# Patient Record
Sex: Female | Born: 1935 | Race: Black or African American | Hispanic: No | Smoking: Former smoker
Health system: Southern US, Community
[De-identification: ages and names within clinical notes are randomized; demographics above are authoritative.]

## PROBLEM LIST (undated history)

## (undated) DIAGNOSIS — L905 Scar conditions and fibrosis of skin: Secondary | ICD-10-CM

## (undated) DIAGNOSIS — E785 Hyperlipidemia, unspecified: Secondary | ICD-10-CM

## (undated) DIAGNOSIS — I5042 Chronic combined systolic (congestive) and diastolic (congestive) heart failure: Secondary | ICD-10-CM

## (undated) DIAGNOSIS — B029 Zoster without complications: Secondary | ICD-10-CM

## (undated) DIAGNOSIS — I10 Essential (primary) hypertension: Secondary | ICD-10-CM

## (undated) DIAGNOSIS — J449 Chronic obstructive pulmonary disease, unspecified: Secondary | ICD-10-CM

## (undated) DIAGNOSIS — M1990 Unspecified osteoarthritis, unspecified site: Secondary | ICD-10-CM

## (undated) DIAGNOSIS — T300 Burn of unspecified body region, unspecified degree: Secondary | ICD-10-CM

## (undated) DIAGNOSIS — M1712 Unilateral primary osteoarthritis, left knee: Secondary | ICD-10-CM

## (undated) DIAGNOSIS — Z720 Tobacco use: Secondary | ICD-10-CM

## (undated) DIAGNOSIS — I2510 Atherosclerotic heart disease of native coronary artery without angina pectoris: Secondary | ICD-10-CM

## (undated) HISTORY — DX: Unspecified osteoarthritis, unspecified site: M19.90

## (undated) HISTORY — DX: Unilateral primary osteoarthritis, left knee: M17.12

## (undated) HISTORY — DX: Atherosclerotic heart disease of native coronary artery without angina pectoris: I25.10

## (undated) HISTORY — DX: Tobacco use: Z72.0

## (undated) HISTORY — PX: OTHER SURGICAL HISTORY: SHX169

## (undated) HISTORY — DX: Hyperlipidemia, unspecified: E78.5

## (undated) HISTORY — DX: Scar conditions and fibrosis of skin: L90.5

## (undated) HISTORY — DX: Chronic obstructive pulmonary disease, unspecified: J44.9

## (undated) HISTORY — DX: Essential (primary) hypertension: I10

## (undated) HISTORY — PX: ABDOMINAL HYSTERECTOMY: SHX81

## (undated) HISTORY — DX: Zoster without complications: B02.9

## (undated) HISTORY — PX: SHOULDER ARTHROSCOPY: SHX128

## (undated) SURGERY — Surgical Case
Anesthesia: *Unknown

---

## 2005-09-16 HISTORY — PX: REPLACEMENT TOTAL KNEE: SUR1224

## 2012-10-23 DIAGNOSIS — D649 Anemia, unspecified: Secondary | ICD-10-CM | POA: Diagnosis not present

## 2012-10-23 DIAGNOSIS — I10 Essential (primary) hypertension: Secondary | ICD-10-CM | POA: Diagnosis not present

## 2012-10-23 DIAGNOSIS — R319 Hematuria, unspecified: Secondary | ICD-10-CM | POA: Diagnosis not present

## 2012-10-23 DIAGNOSIS — R809 Proteinuria, unspecified: Secondary | ICD-10-CM | POA: Diagnosis not present

## 2012-11-03 ENCOUNTER — Other Ambulatory Visit: Payer: Self-pay | Admitting: Nephrology

## 2012-11-03 LAB — CBC WITH DIFFERENTIAL/PLATELET
Basophil #: 0.1 10*3/uL (ref 0.0–0.1)
Eosinophil #: 0.1 10*3/uL (ref 0.0–0.7)
Eosinophil %: 1.1 %
HCT: 39.1 % (ref 35.0–47.0)
HGB: 12.2 g/dL (ref 12.0–16.0)
Lymphocyte #: 1.8 10*3/uL (ref 1.0–3.6)
Lymphocyte %: 27.6 %
MCH: 28.2 pg (ref 26.0–34.0)
MCHC: 31.3 g/dL — ABNORMAL LOW (ref 32.0–36.0)
Monocyte #: 0.7 x10 3/mm (ref 0.2–0.9)
Monocyte %: 11.3 %
Neutrophil %: 58.9 %
Platelet: 234 10*3/uL (ref 150–440)
RBC: 4.34 10*6/uL (ref 3.80–5.20)
RDW: 17.5 % — ABNORMAL HIGH (ref 11.5–14.5)
WBC: 6.5 10*3/uL (ref 3.6–11.0)

## 2012-11-03 LAB — IRON AND TIBC
Iron Bind.Cap.(Total): 355 ug/dL (ref 250–450)
Iron Saturation: 14 %
Iron: 51 ug/dL (ref 50–170)
Unbound Iron-Bind.Cap.: 304 ug/dL

## 2012-11-03 LAB — RENAL FUNCTION PANEL
Albumin: 3.4 g/dL (ref 3.4–5.0)
Anion Gap: 5 — ABNORMAL LOW (ref 7–16)
Calcium, Total: 8.5 mg/dL (ref 8.5–10.1)
Chloride: 112 mmol/L — ABNORMAL HIGH (ref 98–107)
Glucose: 78 mg/dL (ref 65–99)
Osmolality: 291 (ref 275–301)
Phosphorus: 3 mg/dL (ref 2.5–4.9)
Potassium: 3.8 mmol/L (ref 3.5–5.1)
Sodium: 145 mmol/L (ref 136–145)

## 2012-11-03 LAB — FERRITIN: Ferritin (ARMC): 16 ng/mL (ref 8–388)

## 2012-11-03 LAB — T4, FREE: Free Thyroxine: 0.92 ng/dL (ref 0.76–1.46)

## 2012-11-03 LAB — URIC ACID: Uric Acid: 4.7 mg/dL (ref 2.6–6.0)

## 2012-11-04 LAB — PROTEIN ELECTROPHORESIS(ARMC)

## 2012-11-06 DIAGNOSIS — N2581 Secondary hyperparathyroidism of renal origin: Secondary | ICD-10-CM | POA: Diagnosis not present

## 2012-11-10 LAB — PES,RFLX IFE+FREE K/L LT

## 2012-12-23 ENCOUNTER — Other Ambulatory Visit: Payer: Self-pay | Admitting: Ophthalmology

## 2012-12-23 DIAGNOSIS — H16009 Unspecified corneal ulcer, unspecified eye: Secondary | ICD-10-CM | POA: Diagnosis not present

## 2012-12-24 DIAGNOSIS — H16009 Unspecified corneal ulcer, unspecified eye: Secondary | ICD-10-CM | POA: Diagnosis not present

## 2012-12-25 DIAGNOSIS — H16009 Unspecified corneal ulcer, unspecified eye: Secondary | ICD-10-CM | POA: Diagnosis not present

## 2012-12-27 LAB — WOUND CULTURE

## 2012-12-28 DIAGNOSIS — H16009 Unspecified corneal ulcer, unspecified eye: Secondary | ICD-10-CM | POA: Diagnosis not present

## 2014-03-21 DIAGNOSIS — M79609 Pain in unspecified limb: Secondary | ICD-10-CM | POA: Diagnosis not present

## 2014-03-21 DIAGNOSIS — B351 Tinea unguium: Secondary | ICD-10-CM | POA: Diagnosis not present

## 2014-04-21 ENCOUNTER — Ambulatory Visit: Payer: Self-pay | Admitting: Family Medicine

## 2014-04-21 DIAGNOSIS — I517 Cardiomegaly: Secondary | ICD-10-CM | POA: Diagnosis not present

## 2014-04-21 DIAGNOSIS — R062 Wheezing: Secondary | ICD-10-CM | POA: Diagnosis not present

## 2014-04-21 DIAGNOSIS — F17200 Nicotine dependence, unspecified, uncomplicated: Secondary | ICD-10-CM | POA: Diagnosis not present

## 2014-04-21 DIAGNOSIS — J441 Chronic obstructive pulmonary disease with (acute) exacerbation: Secondary | ICD-10-CM | POA: Diagnosis not present

## 2014-04-21 DIAGNOSIS — R059 Cough, unspecified: Secondary | ICD-10-CM | POA: Diagnosis not present

## 2014-04-21 DIAGNOSIS — R05 Cough: Secondary | ICD-10-CM | POA: Diagnosis not present

## 2014-04-21 DIAGNOSIS — I10 Essential (primary) hypertension: Secondary | ICD-10-CM | POA: Diagnosis not present

## 2014-04-25 DIAGNOSIS — F17200 Nicotine dependence, unspecified, uncomplicated: Secondary | ICD-10-CM | POA: Diagnosis not present

## 2014-04-25 DIAGNOSIS — I10 Essential (primary) hypertension: Secondary | ICD-10-CM | POA: Diagnosis not present

## 2014-04-25 DIAGNOSIS — J45901 Unspecified asthma with (acute) exacerbation: Secondary | ICD-10-CM | POA: Diagnosis not present

## 2014-04-25 DIAGNOSIS — I2510 Atherosclerotic heart disease of native coronary artery without angina pectoris: Secondary | ICD-10-CM | POA: Diagnosis not present

## 2014-04-25 DIAGNOSIS — Z23 Encounter for immunization: Secondary | ICD-10-CM | POA: Diagnosis not present

## 2014-04-25 DIAGNOSIS — J441 Chronic obstructive pulmonary disease with (acute) exacerbation: Secondary | ICD-10-CM | POA: Diagnosis not present

## 2014-05-16 DIAGNOSIS — F17200 Nicotine dependence, unspecified, uncomplicated: Secondary | ICD-10-CM | POA: Diagnosis not present

## 2014-05-16 DIAGNOSIS — M1710 Unilateral primary osteoarthritis, unspecified knee: Secondary | ICD-10-CM | POA: Diagnosis not present

## 2014-05-16 DIAGNOSIS — Z0000 Encounter for general adult medical examination without abnormal findings: Secondary | ICD-10-CM | POA: Diagnosis not present

## 2014-05-17 ENCOUNTER — Other Ambulatory Visit: Payer: Self-pay | Admitting: Family Medicine

## 2014-05-17 DIAGNOSIS — M2550 Pain in unspecified joint: Secondary | ICD-10-CM | POA: Diagnosis not present

## 2014-05-17 DIAGNOSIS — R809 Proteinuria, unspecified: Secondary | ICD-10-CM | POA: Diagnosis not present

## 2014-05-17 DIAGNOSIS — I2510 Atherosclerotic heart disease of native coronary artery without angina pectoris: Secondary | ICD-10-CM | POA: Diagnosis not present

## 2014-05-17 DIAGNOSIS — E785 Hyperlipidemia, unspecified: Secondary | ICD-10-CM | POA: Diagnosis not present

## 2014-05-17 LAB — COMPREHENSIVE METABOLIC PANEL
ALBUMIN: 3.6 g/dL (ref 3.4–5.0)
ALK PHOS: 68 U/L
ALT: 22 U/L
Anion Gap: 11 (ref 7–16)
BUN: 20 mg/dL — ABNORMAL HIGH (ref 7–18)
Bilirubin,Total: 0.2 mg/dL (ref 0.2–1.0)
CHLORIDE: 106 mmol/L (ref 98–107)
CO2: 23 mmol/L (ref 21–32)
CREATININE: 0.54 mg/dL — AB (ref 0.60–1.30)
Calcium, Total: 8.8 mg/dL (ref 8.5–10.1)
EGFR (African American): >60
EGFR (Non-African Amer.): >60
Glucose: 72 mg/dL (ref 65–99)
OSMOLALITY: 281 (ref 275–301)
Potassium: 4.2 mmol/L (ref 3.5–5.1)
SGOT(AST): 24 U/L (ref 15–37)
SODIUM: 140 mmol/L (ref 136–145)
TOTAL PROTEIN: 7.9 g/dL (ref 6.4–8.2)

## 2014-05-17 LAB — LIPID PANEL
Cholesterol: 194 mg/dL (ref 0–200)
HDL Cholesterol: 80 mg/dL — ABNORMAL HIGH (ref 40–60)
Ldl Cholesterol, Calc: 102 mg/dL — ABNORMAL HIGH (ref 0–100)
TRIGLYCERIDES: 60 mg/dL (ref 0–200)
VLDL Cholesterol, Calc: 12 mg/dL (ref 5–40)

## 2014-06-06 ENCOUNTER — Ambulatory Visit: Payer: Self-pay | Admitting: Cardiovascular Disease

## 2014-06-08 ENCOUNTER — Encounter: Payer: Self-pay | Admitting: Cardiovascular Disease

## 2014-06-08 ENCOUNTER — Ambulatory Visit (INDEPENDENT_AMBULATORY_CARE_PROVIDER_SITE_OTHER): Payer: Medicare Other | Admitting: Cardiovascular Disease

## 2014-06-08 ENCOUNTER — Encounter (INDEPENDENT_AMBULATORY_CARE_PROVIDER_SITE_OTHER): Payer: Self-pay

## 2014-06-08 VITALS — BP 118/66 | HR 96 | Ht 66.0 in | Wt 158.5 lb

## 2014-06-08 DIAGNOSIS — I351 Nonrheumatic aortic (valve) insufficiency: Secondary | ICD-10-CM | POA: Insufficient documentation

## 2014-06-08 DIAGNOSIS — Z13820 Encounter for screening for osteoporosis: Secondary | ICD-10-CM | POA: Diagnosis not present

## 2014-06-08 DIAGNOSIS — R000 Tachycardia, unspecified: Secondary | ICD-10-CM

## 2014-06-08 DIAGNOSIS — R0602 Shortness of breath: Secondary | ICD-10-CM

## 2014-06-08 DIAGNOSIS — R011 Cardiac murmur, unspecified: Secondary | ICD-10-CM

## 2014-06-08 DIAGNOSIS — I2510 Atherosclerotic heart disease of native coronary artery without angina pectoris: Secondary | ICD-10-CM | POA: Insufficient documentation

## 2014-06-08 DIAGNOSIS — I10 Essential (primary) hypertension: Secondary | ICD-10-CM | POA: Diagnosis not present

## 2014-06-08 DIAGNOSIS — I359 Nonrheumatic aortic valve disorder, unspecified: Secondary | ICD-10-CM

## 2014-06-08 DIAGNOSIS — F17200 Nicotine dependence, unspecified, uncomplicated: Secondary | ICD-10-CM

## 2014-06-08 DIAGNOSIS — E785 Hyperlipidemia, unspecified: Secondary | ICD-10-CM | POA: Insufficient documentation

## 2014-06-08 DIAGNOSIS — E2839 Other primary ovarian failure: Secondary | ICD-10-CM | POA: Diagnosis not present

## 2014-06-08 MED ORDER — METOPROLOL SUCCINATE ER 50 MG PO TB24: 50.0000 mg | ORAL_TABLET | Freq: Two times a day (BID) | ORAL | Status: DC

## 2014-06-08 NOTE — Assessment & Plan Note (Signed)
Recommended that she stay on her Lipitor given her continued smoking.

## 2014-06-08 NOTE — Assessment & Plan Note (Signed)
Tachycardia on EKG and on clinical exam today. We have recommended she increase her metoprolol succinate up to 50 mg twice a day, hold her hydralazine for now. Family members will monitor her blood pressure and restart hydralazine for systolic pressure more than 140.

## 2014-06-08 NOTE — Assessment & Plan Note (Signed)
Long history of smoking, likely COPD. She has a chronic cough consistent with chronic bronchitis. Discussed smoking cessation with her. She has Chantix but is afraid to use it

## 2014-06-08 NOTE — Patient Instructions (Addendum)
You are doing well.  Please hold the hydralazine Please increase the metoprolol up to one pill twice a day (Am and PM)  Please monitor your blood pressure at home  We will schedule you for an echocardiogram for the heart miurmur, fast heart rate/tachycardia  Please call us if you have new issues that need to be addressed before your next appt.  Your physician wants you to follow-up in: 6 months.  You will receive a reminder letter in the mail two months in advance. If you don't receive a letter, please call our office to schedule the follow-up appointment.

## 2014-06-08 NOTE — Progress Notes (Addendum)
Patient ID: Helen Moore, female    DOB: [birth year], 79 y.o.   MRN: [id number]  HPI Comments: Helen Moore is a pleasant 79 year old woman with long history of smoking who continues to smoke, COPD, hypertension, report of prior MI in June 2013 who presents to establish care. She is a patient of Dr. Sherie Don.  Family presents with her today. Family reports that in June 2013, she had an episode where she could not get out of bed. She stayed in bed for 3 days and could not get up. EMTs were called. Family reports a bladder infection, other workup that was done but they are unsure. When asked a cardiac catheterization was completed, they are unclear  Records were requested from [facility] in [place] Records detail 02/27/2012 a VQ scan showing low probability for PE Stress test showing ejection fraction 56%, with no abnormalities, no significant ischemia Echocardiogram showing mild to moderate aortic valve insufficiency, diastolic dysfunction, ejection fraction 45-50%  She reports that in general she feels well. She denies any significant chest pain with exertion. She does have some shortness of breath at times. She denies any significant lower extremity edema. He does report that she stop smoking for several years, but in general has been smoking for most of her life. She does have chronic arthritic pain, takes pain medications on an occasional basis  In terms of her family history, mother died in her 76s, father in his 34s  EKG today shows normal sinus rhythm with rate 96 beats per minute, poor R-wave progression through the anterior precordial leads, nonspecific ST abnormality in lead V6, aVL, left anterior fascicular block   Outpatient Encounter Prescriptions as of 06/08/2014  Medication Sig  . amLODipine (NORVASC) 10 MG tablet Take 10 mg by mouth daily.  Marland Kitchen aspirin 81 MG tablet Take 81 mg by mouth daily.  Marland Kitchen atorvastatin (LIPITOR) 40 MG tablet Take 40 mg by mouth daily.   . hydrALAZINE (APRESOLINE) 25 MG tablet Take 1 tablet (25 mg total) by mouth 3 (three) times daily as needed.  Marland Kitchen HYDROcodone-acetaminophen (NORCO/VICODIN) 5-325 MG per tablet Take 1 tablet by mouth 2 (two) times daily.  Marland Kitchen omeprazole (PRILOSEC) 40 MG capsule Take 40 mg by mouth daily.  . hydrALAZINE (APRESOLINE) 25 MG tablet Take 25 mg by mouth 3 (three) times daily.  . metoprolol succinate (TOPROL-XL) 50 MG 24 hr tablet Take 50 mg by mouth daily. Take with or immediately following a meal.    Review of Systems  Constitutional: Negative.   HENT: Negative.   Eyes: Negative.   Respiratory: Negative.   Cardiovascular: Negative.   Gastrointestinal: Negative.   Endocrine: Negative.   Musculoskeletal: Negative.   Skin: Negative.   Allergic/Immunologic: Negative.   Neurological: Negative.   Hematological: Negative.   Psychiatric/Behavioral: Negative.   All other systems reviewed and are negative.   BP 118/66  Pulse 96  Ht  (1.676 m)  Wt 158 lb 8 oz (71.895 kg)  BMI 25.59 kg/m2  Physical Exam  Nursing note and vitals reviewed. Constitutional: She is oriented to person, place, and time. She appears well-developed and well-nourished.  HENT:  Head: Normocephalic.  Nose: Nose normal.  Mouth/Throat: Oropharynx is clear and moist.  Eyes: Conjunctivae are normal. Pupils are equal, round, and reactive to light.  Neck: Normal range of motion. Neck supple. No JVD present.  Cardiovascular: Normal rate, regular rhythm, S1 normal, S2 normal and intact distal pulses.  Exam reveals no gallop and no friction rub.  Murmur heard.  Diastolic murmur is present with a grade of 2/6  Pulmonary/Chest: Effort normal. No respiratory distress. She has decreased breath sounds. She has no wheezes. She has no rales. She exhibits no tenderness.  Abdominal: Soft. Bowel sounds are normal. She exhibits no distension. There is no tenderness.  Musculoskeletal: Normal range of motion. She exhibits no edema and  no tenderness.  Lymphadenopathy:    She has no cervical adenopathy.  Neurological: She is alert and oriented to person, place, and time. Coordination normal.  Skin: Skin is warm and dry. No rash noted. No erythema.  Psychiatric: She has a normal mood and affect. Her behavior is normal. Judgment and thought content normal.    Assessment and Plan

## 2014-06-08 NOTE — Assessment & Plan Note (Signed)
Metoprolol dose was increased up to 50 mg twice a day, we'll hold hydralazine for systolic pressure today is 116. We'll restart hydralazine if blood pressure runs high

## 2014-06-08 NOTE — Assessment & Plan Note (Signed)
Echocardiogram in 2013 showing mild-to-moderate disease. Murmur on exam.

## 2014-06-08 NOTE — Assessment & Plan Note (Signed)
Echocardiogram several years ago showing mild to moderate aortic valve insufficiency. Prominent murmur on exam today. She and family are requesting repeat echocardiogram. Order has been placed.

## 2014-06-08 NOTE — Assessment & Plan Note (Signed)
Events of June 2013 showed normal stress test with no previous infarct. Ejection fraction on echocardiogram at that time was 45-50%. I suspect she had a urinary tract infection, spent 3 days in bed, had a minimal troponin elevation consistent with demand ischemia. No cardiac catheterization done at that time. No ischemia on stress test. Currently with no symptoms of angina at this time. Continue aggressive medical management, recommend smoking cessation. Echocardiogram has been ordered to evaluate her aortic valve, reassess her ejection fraction

## 2014-06-13 DIAGNOSIS — M1710 Unilateral primary osteoarthritis, unspecified knee: Secondary | ICD-10-CM | POA: Diagnosis not present

## 2014-06-16 ENCOUNTER — Other Ambulatory Visit (INDEPENDENT_AMBULATORY_CARE_PROVIDER_SITE_OTHER): Payer: Medicare Other

## 2014-06-16 ENCOUNTER — Other Ambulatory Visit: Payer: Self-pay

## 2014-06-16 DIAGNOSIS — I351 Nonrheumatic aortic (valve) insufficiency: Secondary | ICD-10-CM

## 2014-06-16 DIAGNOSIS — R0602 Shortness of breath: Secondary | ICD-10-CM

## 2014-06-16 DIAGNOSIS — I10 Essential (primary) hypertension: Secondary | ICD-10-CM

## 2014-06-16 DIAGNOSIS — R011 Cardiac murmur, unspecified: Secondary | ICD-10-CM | POA: Diagnosis not present

## 2014-06-23 ENCOUNTER — Ambulatory Visit: Payer: Self-pay | Admitting: Family Medicine

## 2014-06-23 DIAGNOSIS — I2510 Atherosclerotic heart disease of native coronary artery without angina pectoris: Secondary | ICD-10-CM | POA: Diagnosis not present

## 2014-06-23 DIAGNOSIS — Z122 Encounter for screening for malignant neoplasm of respiratory organs: Secondary | ICD-10-CM | POA: Diagnosis not present

## 2014-06-23 DIAGNOSIS — Z136 Encounter for screening for cardiovascular disorders: Secondary | ICD-10-CM | POA: Diagnosis not present

## 2014-06-23 DIAGNOSIS — F17210 Nicotine dependence, cigarettes, uncomplicated: Secondary | ICD-10-CM | POA: Diagnosis not present

## 2014-06-23 DIAGNOSIS — J449 Chronic obstructive pulmonary disease, unspecified: Secondary | ICD-10-CM | POA: Diagnosis not present

## 2014-06-23 DIAGNOSIS — F17200 Nicotine dependence, unspecified, uncomplicated: Secondary | ICD-10-CM | POA: Diagnosis not present

## 2014-08-10 DIAGNOSIS — I10 Essential (primary) hypertension: Secondary | ICD-10-CM | POA: Diagnosis not present

## 2014-08-10 DIAGNOSIS — J441 Chronic obstructive pulmonary disease with (acute) exacerbation: Secondary | ICD-10-CM | POA: Diagnosis not present

## 2014-08-10 DIAGNOSIS — M179 Osteoarthritis of knee, unspecified: Secondary | ICD-10-CM | POA: Diagnosis not present

## 2014-08-10 DIAGNOSIS — J449 Chronic obstructive pulmonary disease, unspecified: Secondary | ICD-10-CM | POA: Diagnosis not present

## 2014-09-05 DIAGNOSIS — Z79891 Long term (current) use of opiate analgesic: Secondary | ICD-10-CM | POA: Diagnosis not present

## 2014-09-05 DIAGNOSIS — M179 Osteoarthritis of knee, unspecified: Secondary | ICD-10-CM | POA: Diagnosis not present

## 2014-11-02 DIAGNOSIS — F17290 Nicotine dependence, other tobacco product, uncomplicated: Secondary | ICD-10-CM | POA: Diagnosis not present

## 2014-11-02 DIAGNOSIS — J449 Chronic obstructive pulmonary disease, unspecified: Secondary | ICD-10-CM | POA: Diagnosis not present

## 2014-11-02 DIAGNOSIS — I10 Essential (primary) hypertension: Secondary | ICD-10-CM | POA: Diagnosis not present

## 2014-11-02 DIAGNOSIS — I2510 Atherosclerotic heart disease of native coronary artery without angina pectoris: Secondary | ICD-10-CM | POA: Diagnosis not present

## 2014-12-14 DIAGNOSIS — Z79891 Long term (current) use of opiate analgesic: Secondary | ICD-10-CM | POA: Diagnosis not present

## 2014-12-14 DIAGNOSIS — I2510 Atherosclerotic heart disease of native coronary artery without angina pectoris: Secondary | ICD-10-CM | POA: Diagnosis not present

## 2014-12-14 DIAGNOSIS — E785 Hyperlipidemia, unspecified: Secondary | ICD-10-CM | POA: Diagnosis not present

## 2014-12-14 DIAGNOSIS — I10 Essential (primary) hypertension: Secondary | ICD-10-CM | POA: Diagnosis not present

## 2015-01-24 DIAGNOSIS — M79671 Pain in right foot: Secondary | ICD-10-CM | POA: Diagnosis not present

## 2015-01-24 DIAGNOSIS — M79672 Pain in left foot: Secondary | ICD-10-CM | POA: Diagnosis not present

## 2015-01-24 DIAGNOSIS — B351 Tinea unguium: Secondary | ICD-10-CM | POA: Diagnosis not present

## 2015-01-30 DIAGNOSIS — F17290 Nicotine dependence, other tobacco product, uncomplicated: Secondary | ICD-10-CM | POA: Diagnosis not present

## 2015-01-30 DIAGNOSIS — J449 Chronic obstructive pulmonary disease, unspecified: Secondary | ICD-10-CM | POA: Diagnosis not present

## 2015-01-30 DIAGNOSIS — I10 Essential (primary) hypertension: Secondary | ICD-10-CM | POA: Diagnosis not present

## 2015-01-30 DIAGNOSIS — I2510 Atherosclerotic heart disease of native coronary artery without angina pectoris: Secondary | ICD-10-CM | POA: Diagnosis not present

## 2015-02-03 DIAGNOSIS — M1990 Unspecified osteoarthritis, unspecified site: Secondary | ICD-10-CM | POA: Diagnosis not present

## 2015-02-03 DIAGNOSIS — M25561 Pain in right knee: Secondary | ICD-10-CM | POA: Diagnosis not present

## 2015-02-03 DIAGNOSIS — Z96651 Presence of right artificial knee joint: Secondary | ICD-10-CM | POA: Diagnosis not present

## 2015-02-03 DIAGNOSIS — M25511 Pain in right shoulder: Secondary | ICD-10-CM | POA: Diagnosis not present

## 2015-04-06 DIAGNOSIS — M1990 Unspecified osteoarthritis, unspecified site: Secondary | ICD-10-CM | POA: Insufficient documentation

## 2015-04-06 DIAGNOSIS — J441 Chronic obstructive pulmonary disease with (acute) exacerbation: Secondary | ICD-10-CM | POA: Insufficient documentation

## 2015-04-06 DIAGNOSIS — I10 Essential (primary) hypertension: Secondary | ICD-10-CM | POA: Insufficient documentation

## 2015-04-06 DIAGNOSIS — I2510 Atherosclerotic heart disease of native coronary artery without angina pectoris: Secondary | ICD-10-CM | POA: Insufficient documentation

## 2015-04-06 DIAGNOSIS — Z720 Tobacco use: Secondary | ICD-10-CM | POA: Insufficient documentation

## 2015-04-07 ENCOUNTER — Ambulatory Visit (INDEPENDENT_AMBULATORY_CARE_PROVIDER_SITE_OTHER): Payer: Medicare Other | Admitting: Family Medicine

## 2015-04-07 ENCOUNTER — Encounter: Payer: Self-pay | Admitting: Family Medicine

## 2015-04-07 VITALS — BP 125/79 | HR 106 | Temp 97.7°F | Ht 62.7 in | Wt 152.0 lb

## 2015-04-07 DIAGNOSIS — T1490 Injury, unspecified: Secondary | ICD-10-CM | POA: Diagnosis not present

## 2015-04-07 DIAGNOSIS — J439 Emphysema, unspecified: Secondary | ICD-10-CM

## 2015-04-07 DIAGNOSIS — I2510 Atherosclerotic heart disease of native coronary artery without angina pectoris: Secondary | ICD-10-CM

## 2015-04-07 DIAGNOSIS — S50311A Abrasion of right elbow, initial encounter: Secondary | ICD-10-CM

## 2015-04-07 DIAGNOSIS — Z720 Tobacco use: Secondary | ICD-10-CM | POA: Diagnosis not present

## 2015-04-07 DIAGNOSIS — R000 Tachycardia, unspecified: Secondary | ICD-10-CM

## 2015-04-07 DIAGNOSIS — I10 Essential (primary) hypertension: Secondary | ICD-10-CM | POA: Diagnosis not present

## 2015-04-07 DIAGNOSIS — Z129 Encounter for screening for malignant neoplasm, site unspecified: Secondary | ICD-10-CM | POA: Diagnosis not present

## 2015-04-07 DIAGNOSIS — S80211A Abrasion, right knee, initial encounter: Secondary | ICD-10-CM | POA: Diagnosis not present

## 2015-04-07 DIAGNOSIS — W19XXXA Unspecified fall, initial encounter: Secondary | ICD-10-CM | POA: Diagnosis not present

## 2015-04-07 MED ORDER — MUPIROCIN CALCIUM 2 % EX CREA: 1.0000 "application " | TOPICAL_CREAM | Freq: Two times a day (BID) | CUTANEOUS | Status: DC

## 2015-04-07 MED ORDER — HYDRALAZINE HCL 25 MG PO TABS: 25.0000 mg | ORAL_TABLET | Freq: Three times a day (TID) | ORAL | Status: DC | PRN

## 2015-04-07 MED ORDER — AMLODIPINE BESYLATE 10 MG PO TABS: 10.0000 mg | ORAL_TABLET | Freq: Every day | ORAL | Status: DC

## 2015-04-07 NOTE — Assessment & Plan Note (Signed)
Encouraged her to quit smoking; she is not ready to quit; explained risk of lung cancer, emphysema; order chest CT for smoking

## 2015-04-07 NOTE — Patient Instructions (Addendum)
Use the antibacterial cream or ointment on the sores Keep them clean and covered until healed Use half-strength peroxide to clean them Do try to quit smoking We'll order a chest CT for screening to look for lung cancer Have a happy birthday!!   Smoking Cessation, Tips for Success If you are ready to quit smoking, congratulations! You have chosen to help yourself be healthier. Cigarettes bring nicotine, tar, carbon monoxide, and other irritants into your body. Your lungs, heart, and blood vessels will be able to work better without these poisons. There are many different ways to quit smoking. Nicotine gum, nicotine patches, a nicotine inhaler, or nicotine nasal spray can help with physical craving. Hypnosis, support groups, and medicines help break the habit of smoking. WHAT THINGS CAN I DO TO MAKE QUITTING EASIER?  Here are some tips to help you quit for good:  Pick a date when you will quit smoking completely. Tell all of your friends and family about your plan to quit on that date.  Do not try to slowly cut down on the number of cigarettes you are smoking. Pick a quit date and quit smoking completely starting on that day.  Throw away all cigarettes.   Clean and remove all ashtrays from your home, work, and car.  On a card, write down your reasons for quitting. Carry the card with you and read it when you get the urge to smoke.  Cleanse your body of nicotine. Drink enough water and fluids to keep your urine clear or pale yellow. Do this after quitting to flush the nicotine from your body.  Learn to predict your moods. Do not let a bad situation be your excuse to have a cigarette. Some situations in your life might tempt you into wanting a cigarette.  Never have "just one" cigarette. It leads to wanting another and another. Remind yourself of your decision to quit.  Change habits associated with smoking. If you smoked while driving or when feeling stressed, try other activities to  replace smoking. Stand up when drinking your coffee. Brush your teeth after eating. Sit in a different chair when you read the paper. Avoid alcohol while trying to quit, and try to drink fewer caffeinated beverages. Alcohol and caffeine may urge you to smoke.  Avoid foods and drinks that can trigger a desire to smoke, such as sugary or spicy foods and alcohol.  Ask people who smoke not to smoke around you.  Have something planned to do right after eating or having a cup of coffee. For example, plan to take a walk or exercise.  Try a relaxation exercise to calm you down and decrease your stress. Remember, you may be tense and nervous for the first 2 weeks after you quit, but this will pass.  Find new activities to keep your hands busy. Play with a pen, coin, or rubber band. Doodle or draw things on paper.  Brush your teeth right after eating. This will help cut down on the craving for the taste of tobacco after meals. You can also try mouthwash.   Use oral substitutes in place of cigarettes. Try using lemon drops, carrots, cinnamon sticks, or chewing gum. Keep them handy so they are available when you have the urge to smoke.  When you have the urge to smoke, try deep breathing.  Designate your home as a nonsmoking area.  If you are a heavy smoker, ask your health care provider about a prescription for nicotine chewing gum. It can ease your  withdrawal from nicotine.  Reward yourself. Set aside the cigarette money you save and buy yourself something nice.  Look for support from others. Join a support group or smoking cessation program. Ask someone at home or at work to help you with your plan to quit smoking.  Always ask yourself, "Do I need this cigarette or is this just a reflex?" Tell yourself, "Today, I choose not to smoke," or "I do not want to smoke." You are reminding yourself of your decision to quit.  Do not replace cigarette smoking with electronic cigarettes (commonly called  e-cigarettes). The safety of e-cigarettes is unknown, and some may contain harmful chemicals.  If you relapse, do not give up! Plan ahead and think about what you will do the next time you get the urge to smoke. HOW WILL I FEEL WHEN I QUIT SMOKING? You may have symptoms of withdrawal because your body is used to nicotine (the addictive substance in cigarettes). You may crave cigarettes, be irritable, feel very hungry, cough often, get headaches, or have difficulty concentrating. The withdrawal symptoms are only temporary. They are strongest when you first quit but will go away within 10-14 days. When withdrawal symptoms occur, stay in control. Think about your reasons for quitting. Remind yourself that these are signs that your body is healing and getting used to being without cigarettes. Remember that withdrawal symptoms are easier to treat than the major diseases that smoking can cause.  Even after the withdrawal is over, expect periodic urges to smoke. However, these cravings are generally short lived and will go away whether you smoke or not. Do not smoke! WHAT RESOURCES ARE AVAILABLE TO HELP ME QUIT SMOKING? Your health care provider can direct you to community resources or hospitals for support, which may include:  Group support.  Education.  Hypnosis.  Therapy. Document Released: 05/31/2004 Document Revised: 01/17/2014 Document Reviewed: 02/18/2013 [facility] Patient Information 2015 [place], [place]. This information is not intended to replace advice given to you by your health care provider. Make sure you discuss any questions you have with your health care provider.

## 2015-04-07 NOTE — Progress Notes (Signed)
BP 125/79 mmHg  Pulse 106  Temp(Src) 97.7 F (36.5 C)  Ht 5' 2.7" (1.593 m)  Wt 152 lb (68.947 kg)  BMI 27.17 kg/m2  SpO2 97%   Subjective:    Patient ID: Helen Moore, female    DOB: [birth year], 80 y.o.   MRN: [id number]  HPI: Helen Moore is a 80 y.o. female  No chief complaint on file.  She fell on Tuesday; she was going up into a building, went to step on the curb and fell; she went to step up and had old flip flops on; she was not using a cane or walker; she scraped up the right knee and right elbow; she has been putting peroxide on it; no antibiotic ointment; no fevers  She has hypertension; her blood pressure is not checked away from the doctor; avoiding salt for the most part  Coughing some, had a little cough; she has had scans; she smokes 1/3 ppd; not ready to quit  Relevant past medical, surgical, family and social history reviewed and updated as indicated. Interim medical history since our last visit reviewed. Allergies and medications reviewed and updated.  Review of Systems  Per HPI unless specifically indicated above     Objective:    BP 125/79 mmHg  Pulse 106  Temp(Src) 97.7 F (36.5 C)  Ht 5' 2.7" (1.593 m)  Wt 152 lb (68.947 kg)  BMI 27.17 kg/m2  SpO2 97%  Wt Readings from Last 3 Encounters:  04/07/15 152 lb (68.947 kg)  01/30/15 155 lb (70.308 kg)  06/08/14 158 lb 8 oz (71.895 kg)    Physical Exam  Constitutional: She appears well-developed and well-nourished. No distress.  HENT:  Head: Normocephalic and atraumatic.  Eyes: EOM are normal. No scleral icterus.  Neck: No thyromegaly present.  Cardiovascular: Normal rate, regular rhythm and normal heart sounds.   No murmur heard. Rate during auscultation was around 92  Pulmonary/Chest: Effort normal. No respiratory distress.  Few scattered rhonchi which partially cleared with cough  Abdominal: Soft. Bowel sounds are normal. She exhibits no distension.  Musculoskeletal: Normal range of  motion. She exhibits no edema.  Neurological: She is alert. She exhibits normal muscle tone.  Skin: Skin is warm and dry. Abrasion (two abrasions; one on extensor surface of right elbow, the other on extensor surface of right knee; lesion on elbow has small amount of yellowish dried drainage) noted. She is not diaphoretic. No pallor.  Psychiatric: She has a normal mood and affect. Her behavior is normal. Judgment and thought content normal.    Results for orders placed or performed in visit on 05/17/14  Comprehensive metabolic panel  Result Value Ref Range   Glucose 72 65-99 mg/dL   BUN 20 (H) 7-18 mg/dL   Creatinine 0.54 (L) 0.60-1.30 mg/dL   Sodium 140 136-145 mmol/L   Potassium 4.2 3.5-5.1 mmol/L   Chloride 106 98-107 mmol/L   Co2 23 21-32 mmol/L   Calcium, Total 8.8 8.5-10.1 mg/dL   SGOT(AST) 24 15-37 Unit/L   SGPT (ALT) 22 U/L   Alkaline Phosphatase 68 Unit/L   Albumin 3.6 3.4-5.0 g/dL   Total Protein 7.9 6.4-8.2 g/dL   Bilirubin,Total 0.2 0.2-1.0 mg/dL   Osmolality 281 275-301   Anion Gap 11 7-16   EGFR (African American) >60    EGFR (Non-African Amer.) >60   Lipid panel  Result Value Ref Range   Cholesterol 194 0-200 mg/dL   Triglycerides 60 0-200 mg/dL   HDL Cholesterol 80 (H) 40-60 mg/dL  VLDL Cholesterol, Calc 12 5-40 mg/dL   Ldl Cholesterol, Calc 102 (H) 0-100 mg/dL      Assessment & Plan:   Problem List Items Addressed This Visit      Cardiovascular and Mediastinum   Essential hypertension - Primary    Well-controlled; continue current regimen      Relevant Medications   amLODipine (NORVASC) 10 MG tablet   hydrALAZINE (APRESOLINE) 25 MG tablet   Coronary artery disease    Followed by cardiologist, Dr. Rockey Situ; smoking cessation so important but patient is not ready to quit      Relevant Medications   amLODipine (NORVASC) 10 MG tablet   hydrALAZINE (APRESOLINE) 25 MG tablet     Respiratory   COPD (chronic obstructive pulmonary disease)    Patient  not interested in quitting smoking        Other   Tachycardia    Noted on check-in, but rate under 100 during physical exam      Tobacco abuse    Encouraged her to quit smoking; she is not ready to quit; explained risk of lung cancer, emphysema; order chest CT for smoking      Relevant Orders   CT CHEST LUNG CA SCREEN LOW DOSE W/O CM    Other Visit Diagnoses    Elbow abrasion, right, initial encounter        dressed by CMA; bactroban cream/ointment prescribed    Knee abrasion, right, initial encounter        dressed by CMA; bactroban cream/ointment prescribed    Special screening for cancer        Relevant Orders    CT CHEST LUNG CA SCREEN LOW DOSE W/O CM    Fall with injury        abrasions dressed by CMA; patient not interested in physical therapy; proper getting out of chair important to keep leg muscles strong        Follow up plan: Return in about 6 months (around 10/08/2015).

## 2015-04-09 NOTE — Assessment & Plan Note (Signed)
Noted on check-in, but rate under 100 during physical exam

## 2015-04-09 NOTE — Assessment & Plan Note (Signed)
Well controlled continue current regimen.  

## 2015-04-09 NOTE — Assessment & Plan Note (Signed)
Followed by cardiologist, Dr. Mariah Milling; smoking cessation so important but patient is not ready to quit

## 2015-04-09 NOTE — Assessment & Plan Note (Signed)
Patient not interested in quitting smoking

## 2015-05-08 ENCOUNTER — Other Ambulatory Visit: Payer: Self-pay | Admitting: Family Medicine

## 2015-05-08 NOTE — Telephone Encounter (Signed)
rx

## 2015-07-10 ENCOUNTER — Encounter: Payer: Self-pay | Admitting: Family Medicine

## 2015-07-10 ENCOUNTER — Ambulatory Visit (INDEPENDENT_AMBULATORY_CARE_PROVIDER_SITE_OTHER): Payer: Medicare Other | Admitting: Family Medicine

## 2015-07-10 VITALS — BP 155/82 | HR 104 | Temp 97.4°F | Ht 62.75 in | Wt 148.0 lb

## 2015-07-10 DIAGNOSIS — Z5181 Encounter for therapeutic drug level monitoring: Secondary | ICD-10-CM

## 2015-07-10 DIAGNOSIS — I2510 Atherosclerotic heart disease of native coronary artery without angina pectoris: Secondary | ICD-10-CM

## 2015-07-10 DIAGNOSIS — R000 Tachycardia, unspecified: Secondary | ICD-10-CM

## 2015-07-10 DIAGNOSIS — R05 Cough: Secondary | ICD-10-CM | POA: Insufficient documentation

## 2015-07-10 DIAGNOSIS — J438 Other emphysema: Secondary | ICD-10-CM

## 2015-07-10 DIAGNOSIS — R634 Abnormal weight loss: Secondary | ICD-10-CM | POA: Diagnosis not present

## 2015-07-10 DIAGNOSIS — I2583 Coronary atherosclerosis due to lipid rich plaque: Secondary | ICD-10-CM

## 2015-07-10 DIAGNOSIS — E785 Hyperlipidemia, unspecified: Secondary | ICD-10-CM | POA: Diagnosis not present

## 2015-07-10 DIAGNOSIS — Z720 Tobacco use: Secondary | ICD-10-CM | POA: Diagnosis not present

## 2015-07-10 DIAGNOSIS — M1990 Unspecified osteoarthritis, unspecified site: Secondary | ICD-10-CM | POA: Diagnosis not present

## 2015-07-10 DIAGNOSIS — I10 Essential (primary) hypertension: Secondary | ICD-10-CM

## 2015-07-10 DIAGNOSIS — E538 Deficiency of other specified B group vitamins: Secondary | ICD-10-CM | POA: Diagnosis not present

## 2015-07-10 DIAGNOSIS — R059 Cough, unspecified: Secondary | ICD-10-CM

## 2015-07-10 MED ORDER — AMLODIPINE BESYLATE 10 MG PO TABS: 10.0000 mg | ORAL_TABLET | Freq: Every day | ORAL | Status: DC

## 2015-07-10 MED ORDER — HYDRALAZINE HCL 25 MG PO TABS: 25.0000 mg | ORAL_TABLET | Freq: Three times a day (TID) | ORAL | Status: DC | PRN

## 2015-07-10 MED ORDER — MELOXICAM 15 MG PO TABS: 15.0000 mg | ORAL_TABLET | Freq: Every day | ORAL | Status: DC

## 2015-07-10 MED ORDER — ALBUTEROL SULFATE HFA 108 (90 BASE) MCG/ACT IN AERS: 2.0000 | INHALATION_SPRAY | RESPIRATORY_TRACT | Status: DC | PRN

## 2015-07-10 MED ORDER — METOPROLOL SUCCINATE ER 50 MG PO TB24: 50.0000 mg | ORAL_TABLET | Freq: Every day | ORAL | Status: DC

## 2015-07-10 NOTE — Assessment & Plan Note (Signed)
Encouraged her to quit smoking; see AVS

## 2015-07-10 NOTE — Assessment & Plan Note (Signed)
She does not want any medicine to help her breathing when asked

## 2015-07-10 NOTE — Assessment & Plan Note (Signed)
Continue meloxicam

## 2015-07-10 NOTE — Assessment & Plan Note (Signed)
Start back on beta-blocker at 50 mg daily; continue CCB and hydralazine; limit salt and follow DASH guidelines

## 2015-07-10 NOTE — Assessment & Plan Note (Signed)
She has been off of her beta-blocker, new Rx written for once a day 50 mg

## 2015-07-10 NOTE — Assessment & Plan Note (Signed)
Get back on beta-blocker; continue aspirin; quit smoking

## 2015-07-10 NOTE — Patient Instructions (Addendum)
Start back on the metoprolol to help your heart rate and blood pressure  Do try to quit smoking completely We'll get a chest CT with your cough and smoking  Smoking Cessation, Tips for Success If you are ready to quit smoking, congratulations! You have chosen to help yourself be healthier. Cigarettes bring nicotine, tar, carbon monoxide, and other irritants into your body. Your lungs, heart, and blood vessels will be able to work better without these poisons. There are many different ways to quit smoking. Nicotine gum, nicotine patches, a nicotine inhaler, or nicotine nasal spray can help with physical craving. Hypnosis, support groups, and medicines help break the habit of smoking. WHAT THINGS CAN I DO TO MAKE QUITTING EASIER?  Here are some tips to help you quit for good:  Pick a date when you will quit smoking completely. Tell all of your friends and family about your plan to quit on that date.  Do not try to slowly cut down on the number of cigarettes you are smoking. Pick a quit date and quit smoking completely starting on that day.  Throw away all cigarettes.   Clean and remove all ashtrays from your home, work, and car.  On a card, write down your reasons for quitting. Carry the card with you and read it when you get the urge to smoke.  Cleanse your body of nicotine. Drink enough water and fluids to keep your urine clear or pale yellow. Do this after quitting to flush the nicotine from your body.  Learn to predict your moods. Do not let a bad situation be your excuse to have a cigarette. Some situations in your life might tempt you into wanting a cigarette.  Never have "just one" cigarette. It leads to wanting another and another. Remind yourself of your decision to quit.  Change habits associated with smoking. If you smoked while driving or when feeling stressed, try other activities to replace smoking. Stand up when drinking your coffee. Brush your teeth after eating. Sit in a  different chair when you read the paper. Avoid alcohol while trying to quit, and try to drink fewer caffeinated beverages. Alcohol and caffeine may urge you to smoke.  Avoid foods and drinks that can trigger a desire to smoke, such as sugary or spicy foods and alcohol.  Ask people who smoke not to smoke around you.  Have something planned to do right after eating or having a cup of coffee. For example, plan to take a walk or exercise.  Try a relaxation exercise to calm you down and decrease your stress. Remember, you may be tense and nervous for the first 2 weeks after you quit, but this will pass.  Find new activities to keep your hands busy. Play with a pen, coin, or rubber band. Doodle or draw things on paper.  Brush your teeth right after eating. This will help cut down on the craving for the taste of tobacco after meals. You can also try mouthwash.   Use oral substitutes in place of cigarettes. Try using lemon drops, carrots, cinnamon sticks, or chewing gum. Keep them handy so they are available when you have the urge to smoke.  When you have the urge to smoke, try deep breathing.  Designate your home as a nonsmoking area.  If you are a heavy smoker, ask your health care provider about a prescription for nicotine chewing gum. It can ease your withdrawal from nicotine.  Reward yourself. Set aside the cigarette money you save and  buy yourself something nice.  Look for support from others. Join a support group or smoking cessation program. Ask someone at home or at work to help you with your plan to quit smoking.  Always ask yourself, "Do I need this cigarette or is this just a reflex?" Tell yourself, "Today, I choose not to smoke," or "I do not want to smoke." You are reminding yourself of your decision to quit.  Do not replace cigarette smoking with electronic cigarettes (commonly called e-cigarettes). The safety of e-cigarettes is unknown, and some may contain harmful  chemicals.  If you relapse, do not give up! Plan ahead and think about what you will do the next time you get the urge to smoke. HOW WILL I FEEL WHEN I QUIT SMOKING? You may have symptoms of withdrawal because your body is used to nicotine (the addictive substance in cigarettes). You may crave cigarettes, be irritable, feel very hungry, cough often, get headaches, or have difficulty concentrating. The withdrawal symptoms are only temporary. They are strongest when you first quit but will go away within 10-14 days. When withdrawal symptoms occur, stay in control. Think about your reasons for quitting. Remind yourself that these are signs that your body is healing and getting used to being without cigarettes. Remember that withdrawal symptoms are easier to treat than the major diseases that smoking can cause.  Even after the withdrawal is over, expect periodic urges to smoke. However, these cravings are generally short lived and will go away whether you smoke or not. Do not smoke! WHAT RESOURCES ARE AVAILABLE TO HELP ME QUIT SMOKING? Your health care provider can direct you to community resources or hospitals for support, which may include:  Group support.  Education.  Hypnosis.  Therapy.   This information is not intended to replace advice given to you by your health care provider. Make sure you discuss any questions you have with your health care provider.   Document Released: 05/31/2004 Document Revised: 09/23/2014 Document Reviewed: 02/18/2013 Elsevier Interactive Patient Education [employer]

## 2015-07-10 NOTE — Assessment & Plan Note (Signed)
Limit saturated fats; check lipids today 

## 2015-07-10 NOTE — Assessment & Plan Note (Signed)
Check TSH, get chest CT to r/o lung cancer, also check glucose to r/o diabetes given family hx
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
## 2015-07-10 NOTE — Assessment & Plan Note (Signed)
Check level today, since she used to take shots

## 2015-07-12 ENCOUNTER — Other Ambulatory Visit: Payer: Medicare Other

## 2015-07-13 ENCOUNTER — Telehealth: Payer: Self-pay | Admitting: Family Medicine

## 2015-07-13 ENCOUNTER — Ambulatory Visit: Admission: RE | Admit: 2015-07-13 | Payer: Medicare Other | Source: Ambulatory Visit

## 2015-07-13 ENCOUNTER — Other Ambulatory Visit: Admission: RE | Admit: 2015-07-13 | Payer: Medicare Other | Source: Ambulatory Visit | Admitting: Family Medicine

## 2015-07-13 DIAGNOSIS — Z720 Tobacco use: Secondary | ICD-10-CM

## 2015-07-13 DIAGNOSIS — R059 Cough, unspecified: Secondary | ICD-10-CM

## 2015-07-13 DIAGNOSIS — R05 Cough: Secondary | ICD-10-CM

## 2015-07-13 DIAGNOSIS — R634 Abnormal weight loss: Secondary | ICD-10-CM

## 2015-07-13 NOTE — Telephone Encounter (Signed)
Thank you for letting me know Cancel the contrast and order chest CT WITHOUT contrast for same reasons Thank you.

## 2015-07-13 NOTE — Telephone Encounter (Signed)
They are unable to get IV in for contrast access.  Do you want to schedule a CT without contrast?  They were unable to get the labs as well.  Please call pt to advise next steps.

## 2015-07-17 NOTE — Telephone Encounter (Signed)
New order placed.  Please schedule.

## 2015-07-27 ENCOUNTER — Ambulatory Visit: Payer: Medicare Other | Attending: Family Medicine

## 2015-07-27 DIAGNOSIS — M79672 Pain in left foot: Secondary | ICD-10-CM | POA: Diagnosis not present

## 2015-07-27 DIAGNOSIS — M79671 Pain in right foot: Secondary | ICD-10-CM | POA: Diagnosis not present

## 2015-07-27 DIAGNOSIS — B351 Tinea unguium: Secondary | ICD-10-CM | POA: Diagnosis not present

## 2015-08-02 ENCOUNTER — Encounter: Payer: Self-pay | Admitting: Family Medicine

## 2015-08-02 ENCOUNTER — Ambulatory Visit (INDEPENDENT_AMBULATORY_CARE_PROVIDER_SITE_OTHER): Payer: Medicare Other | Admitting: Family Medicine

## 2015-08-02 VITALS — BP 147/73 | HR 82 | Temp 97.7°F | Wt 154.0 lb

## 2015-08-02 DIAGNOSIS — J441 Chronic obstructive pulmonary disease with (acute) exacerbation: Secondary | ICD-10-CM | POA: Diagnosis not present

## 2015-08-02 DIAGNOSIS — Z720 Tobacco use: Secondary | ICD-10-CM

## 2015-08-02 DIAGNOSIS — I10 Essential (primary) hypertension: Secondary | ICD-10-CM

## 2015-08-02 DIAGNOSIS — E785 Hyperlipidemia, unspecified: Secondary | ICD-10-CM

## 2015-08-02 DIAGNOSIS — Z5181 Encounter for therapeutic drug level monitoring: Secondary | ICD-10-CM | POA: Diagnosis not present

## 2015-08-02 DIAGNOSIS — I2583 Coronary atherosclerosis due to lipid rich plaque: Secondary | ICD-10-CM

## 2015-08-02 DIAGNOSIS — I2510 Atherosclerotic heart disease of native coronary artery without angina pectoris: Secondary | ICD-10-CM | POA: Diagnosis not present

## 2015-08-02 MED ORDER — BENZONATATE 100 MG PO CAPS: 100.0000 mg | ORAL_CAPSULE | Freq: Two times a day (BID) | ORAL | Status: DC | PRN

## 2015-08-02 MED ORDER — PREDNISONE 10 MG PO TABS: ORAL_TABLET | ORAL | Status: DC

## 2015-08-02 MED ORDER — MELOXICAM 15 MG PO TABS: 15.0000 mg | ORAL_TABLET | Freq: Every day | ORAL | Status: DC
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
## 2015-08-02 NOTE — Patient Instructions (Addendum)
Use the new cough medicine pills as directed if you need them Use the new prednisone prescription for five days, five pills the first day and four pills the next day and so on as you taper down Take the prednisone with food to help it sit easier on your stomach Get rest and hydration Seek medical attention if getting worse Do get the chest CT as soon as possible; if you need to reschedule, you have that number Your heart rate and blood pressure are much better today Once you are finished with the prednisone, you can start on the meloxicam

## 2015-08-06 DIAGNOSIS — J441 Chronic obstructive pulmonary disease with (acute) exacerbation: Secondary | ICD-10-CM | POA: Insufficient documentation
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
## 2015-08-06 NOTE — Assessment & Plan Note (Signed)
Better control today; continue medicines; try to quit smoking

## 2015-08-06 NOTE — Assessment & Plan Note (Signed)
Will treat with round of prednisone, cough medicine; reasons to go to the hospital reviewed; chest CT has been ordered and she will be having that done soon

## 2015-08-06 NOTE — Assessment & Plan Note (Signed)
Patient is not ready to quit; encouragement given; I am here to help if and when she is ready to quit

## 2015-08-06 NOTE — Assessment & Plan Note (Signed)
She unfortunately continues to smoke; continue statin, aspirin, beta-blocker

## 2015-08-08 ENCOUNTER — Ambulatory Visit: Payer: Medicare Other

## 2015-08-18 ENCOUNTER — Ambulatory Visit: Admission: RE | Admit: 2015-08-18 | Payer: Medicare Other | Source: Ambulatory Visit

## 2015-09-04 ENCOUNTER — Telehealth: Payer: Self-pay | Admitting: *Deleted

## 2015-09-04 NOTE — Telephone Encounter (Signed)
error 

## 2015-10-10 ENCOUNTER — Ambulatory Visit: Payer: Medicare Other | Admitting: Family Medicine

## 2015-10-16 ENCOUNTER — Ambulatory Visit: Payer: Medicare Other | Admitting: Family Medicine

## 2015-11-02 ENCOUNTER — Ambulatory Visit: Payer: Medicare Other | Admitting: Family Medicine

## 2015-12-25 ENCOUNTER — Ambulatory Visit (INDEPENDENT_AMBULATORY_CARE_PROVIDER_SITE_OTHER): Payer: Medicare Other | Admitting: Family Medicine

## 2015-12-25 VITALS — BP 136/68 | HR 99 | Temp 98.7°F | Resp 18 | Ht 63.0 in | Wt 151.6 lb

## 2015-12-25 DIAGNOSIS — M1990 Unspecified osteoarthritis, unspecified site: Secondary | ICD-10-CM | POA: Diagnosis not present

## 2015-12-25 DIAGNOSIS — Z720 Tobacco use: Secondary | ICD-10-CM | POA: Diagnosis not present

## 2015-12-25 DIAGNOSIS — J438 Other emphysema: Secondary | ICD-10-CM | POA: Diagnosis not present

## 2015-12-25 DIAGNOSIS — I2583 Coronary atherosclerosis due to lipid rich plaque: Secondary | ICD-10-CM

## 2015-12-25 DIAGNOSIS — Z5181 Encounter for therapeutic drug level monitoring: Secondary | ICD-10-CM | POA: Diagnosis not present

## 2015-12-25 DIAGNOSIS — R05 Cough: Secondary | ICD-10-CM

## 2015-12-25 DIAGNOSIS — I10 Essential (primary) hypertension: Secondary | ICD-10-CM

## 2015-12-25 DIAGNOSIS — R059 Cough, unspecified: Secondary | ICD-10-CM

## 2015-12-25 DIAGNOSIS — I2510 Atherosclerotic heart disease of native coronary artery without angina pectoris: Secondary | ICD-10-CM | POA: Diagnosis not present

## 2015-12-25 MED ORDER — TIOTROPIUM BROMIDE MONOHYDRATE 2.5 MCG/ACT IN AERS: 2.0000 | INHALATION_SPRAY | Freq: Every day | RESPIRATORY_TRACT | Status: DC
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
## 2015-12-25 NOTE — Assessment & Plan Note (Addendum)
Urged smoking cessation; start spiriva; she does not sound interested in seeing lung doctor; oxygen is 98% today; will have her return for spirometry

## 2015-12-25 NOTE — Patient Instructions (Addendum)
Stop meloxicam Go to the hospital for fasting labs We'll refer you to the pain clinic for your pain I URGE you to quit smoking; cigarettes are deadly Start Spiriva to help your breathing Do have the chest CT done as soon as they can schedule it Try stopping the atorvastatin for two weeks to see if that helps with your aches and pains; if no change, then start back
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
## 2015-12-27 ENCOUNTER — Ambulatory Visit: Payer: Medicare Other | Admitting: Family Medicine

## 2015-12-27 ENCOUNTER — Encounter: Payer: Self-pay | Admitting: Family Medicine

## 2015-12-27 NOTE — Assessment & Plan Note (Signed)
encourged patient to quit smoking; I am here to help if/when she is ready to stop

## 2015-12-27 NOTE — Assessment & Plan Note (Signed)
Well controlled today.

## 2015-12-27 NOTE — Assessment & Plan Note (Signed)
Check sgpt on statin 

## 2015-12-27 NOTE — Assessment & Plan Note (Signed)
And COPD and smoking history; I have been trying to get chest CT done for this patient for quite some time now; I asked her to please try again to get this done

## 2016-01-22 ENCOUNTER — Ambulatory Visit: Payer: Medicare Other | Admitting: Family Medicine

## 2016-01-31 ENCOUNTER — Other Ambulatory Visit: Payer: Self-pay | Admitting: Pain Medicine

## 2016-02-01 ENCOUNTER — Ambulatory Visit: Payer: Medicare Other | Admitting: Pain Medicine

## 2016-03-12 ENCOUNTER — Ambulatory Visit (INDEPENDENT_AMBULATORY_CARE_PROVIDER_SITE_OTHER): Payer: Medicare Other | Admitting: Family Medicine

## 2016-03-12 ENCOUNTER — Encounter: Payer: Self-pay | Admitting: Family Medicine

## 2016-03-12 VITALS — BP 120/72 | HR 90 | Temp 97.9°F | Resp 16 | Wt 154.7 lb

## 2016-03-12 DIAGNOSIS — E538 Deficiency of other specified B group vitamins: Secondary | ICD-10-CM | POA: Diagnosis not present

## 2016-03-12 DIAGNOSIS — E785 Hyperlipidemia, unspecified: Secondary | ICD-10-CM

## 2016-03-12 DIAGNOSIS — J438 Other emphysema: Secondary | ICD-10-CM

## 2016-03-12 DIAGNOSIS — I2583 Coronary atherosclerosis due to lipid rich plaque: Secondary | ICD-10-CM

## 2016-03-12 DIAGNOSIS — Z5181 Encounter for therapeutic drug level monitoring: Secondary | ICD-10-CM

## 2016-03-12 DIAGNOSIS — Z720 Tobacco use: Secondary | ICD-10-CM | POA: Diagnosis not present

## 2016-03-12 DIAGNOSIS — I2510 Atherosclerotic heart disease of native coronary artery without angina pectoris: Secondary | ICD-10-CM

## 2016-03-12 DIAGNOSIS — I10 Essential (primary) hypertension: Secondary | ICD-10-CM | POA: Diagnosis not present

## 2016-03-12 MED ORDER — TIOTROPIUM BROMIDE MONOHYDRATE 18 MCG IN CAPS: 18.0000 ug | ORAL_CAPSULE | Freq: Every day | RESPIRATORY_TRACT | Status: AC

## 2016-03-12 NOTE — Assessment & Plan Note (Addendum)
Recommended smoking cessation; see AVS; she declined referral to pulmonologist; I will leave an open invitation for lung specialist on the chart; start back on Spiriva; she's currently using nothing

## 2016-03-12 NOTE — Assessment & Plan Note (Signed)
Try DASH guidelines; encouraged smoking cessation; continue medications

## 2016-03-12 NOTE — Assessment & Plan Note (Signed)
Check B12 level. 

## 2016-03-12 NOTE — Assessment & Plan Note (Signed)
Encouraged patient to call Dr. Mariah MillingGollan and set up an appointment; continue aspirin; check lipids

## 2016-03-12 NOTE — Assessment & Plan Note (Signed)
I told patient and her sister that I really, really need her to get her labs drawn; orders printed and given to patient; she promises to go in the next week

## 2016-03-12 NOTE — Assessment & Plan Note (Signed)
Encouraged pt to get her labs checked; continue statin
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
## 2016-03-12 NOTE — Patient Instructions (Signed)
Try to limit saturated fats in your diet (bologna, hot dogs, barbeque, cheeseburgers, hamburgers, steak, bacon, sausage, cheese, etc.) and get more fresh fruits, vegetables, and whole grains I do encourage you to quit smoking Call [phone] to sign up for smoking cessation classes You can call 1-800-QUIT-NOW to talk with a smoking cessation coach  Please have fasting labs done in the next week  Please do have the scan of your lungs to screen for lung cancer  Please do see your cardiologist about your heart

## 2016-03-12 NOTE — Assessment & Plan Note (Addendum)
Stressed importance of getting yearly CT of the chest; cautioned about risk of lung cancer; offered help with smoking cessation; she says she can quit if she puts her mind to it

## 2016-05-13 ENCOUNTER — Ambulatory Visit: Payer: Medicare Other | Admitting: Cardiovascular Disease

## 2016-05-13 ENCOUNTER — Encounter: Payer: Self-pay | Admitting: *Deleted

## 2016-07-12 ENCOUNTER — Ambulatory Visit (INDEPENDENT_AMBULATORY_CARE_PROVIDER_SITE_OTHER): Payer: Medicare Other | Admitting: Family Medicine

## 2016-07-12 ENCOUNTER — Encounter: Payer: Self-pay | Admitting: Family Medicine

## 2016-07-12 VITALS — BP 120/68 | HR 98 | Temp 98.1°F | Resp 14 | Wt 155.0 lb

## 2016-07-12 DIAGNOSIS — Z9119 Patient's noncompliance with other medical treatment and regimen: Secondary | ICD-10-CM | POA: Diagnosis not present

## 2016-07-12 DIAGNOSIS — I2583 Coronary atherosclerosis due to lipid rich plaque: Secondary | ICD-10-CM

## 2016-07-12 DIAGNOSIS — E782 Mixed hyperlipidemia: Secondary | ICD-10-CM | POA: Diagnosis not present

## 2016-07-12 DIAGNOSIS — M1712 Unilateral primary osteoarthritis, left knee: Secondary | ICD-10-CM | POA: Diagnosis not present

## 2016-07-12 DIAGNOSIS — Z5181 Encounter for therapeutic drug level monitoring: Secondary | ICD-10-CM

## 2016-07-12 DIAGNOSIS — J438 Other emphysema: Secondary | ICD-10-CM

## 2016-07-12 DIAGNOSIS — Z720 Tobacco use: Secondary | ICD-10-CM | POA: Diagnosis not present

## 2016-07-12 DIAGNOSIS — I351 Nonrheumatic aortic (valve) insufficiency: Secondary | ICD-10-CM | POA: Diagnosis not present

## 2016-07-12 DIAGNOSIS — Z91199 Patient's noncompliance with other medical treatment and regimen due to unspecified reason: Secondary | ICD-10-CM

## 2016-07-12 DIAGNOSIS — R350 Frequency of micturition: Secondary | ICD-10-CM | POA: Diagnosis not present

## 2016-07-12 DIAGNOSIS — E538 Deficiency of other specified B group vitamins: Secondary | ICD-10-CM | POA: Diagnosis not present

## 2016-07-12 DIAGNOSIS — I700 Atherosclerosis of aorta: Secondary | ICD-10-CM

## 2016-07-12 DIAGNOSIS — I2510 Atherosclerotic heart disease of native coronary artery without angina pectoris: Secondary | ICD-10-CM

## 2016-07-12 HISTORY — DX: Unilateral primary osteoarthritis, left knee: M17.12

## 2016-07-12 LAB — CBC WITH DIFFERENTIAL/PLATELET
BASOS PCT: 0 %
Basophils Absolute: 0 cells/uL (ref 0–200)
Eosinophils Absolute: 81 cells/uL (ref 15–500)
Eosinophils Relative: 1 %
HEMATOCRIT: 44.6 % (ref 35.0–45.0)
HEMOGLOBIN: 14.4 g/dL (ref 11.7–15.5)
LYMPHS ABS: 1782 {cells}/uL (ref 850–3900)
LYMPHS PCT: 22 %
MCH: 30.4 pg (ref 27.0–33.0)
MCHC: 32.3 g/dL (ref 32.0–36.0)
MCV: 94.3 fL (ref 80.0–100.0)
MONO ABS: 648 {cells}/uL (ref 200–950)
MPV: 9.5 fL (ref 7.5–12.5)
Monocytes Relative: 8 %
Neutro Abs: 5589 cells/uL (ref 1500–7800)
Neutrophils Relative %: 69 %
Platelets: 280 10*3/uL (ref 140–400)
RBC: 4.73 MIL/uL (ref 3.80–5.10)
RDW: 15.3 % — AB (ref 11.0–15.0)
WBC: 8.1 10*3/uL (ref 3.8–10.8)

## 2016-07-12 LAB — VITAMIN B12: Vitamin B-12: 349 pg/mL (ref 200–1100)

## 2016-07-12 NOTE — Assessment & Plan Note (Signed)
Overdue for CT scan; well overdue for labs; patient not willing to get labs today; agrees to return for labs another day; I explained that I absolutely must have lab results in the next month, as it's very important to check for diabetes, know what her cholesterol is, check kidney and liver function, etc; she says she will go

## 2016-07-12 NOTE — Assessment & Plan Note (Signed)
Has seen PA [place]; limits ability to do IADLs; request PCS

## 2016-07-12 NOTE — Assessment & Plan Note (Signed)
Patient unfortunately continues to smoke and has stopped taking her statin; blood pressure is well-controlled

## 2016-07-12 NOTE — Assessment & Plan Note (Signed)
Check fasting labs; limit saturated fats 
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
## 2016-07-12 NOTE — Assessment & Plan Note (Signed)
Check urine today; limit or cut out caffeine and chocolate

## 2016-07-12 NOTE — Assessment & Plan Note (Addendum)
Asymptomatic; check lipids; urged patient to quit smoking; continue beta-blocker

## 2016-07-12 NOTE — Assessment & Plan Note (Signed)
Check level 

## 2016-07-12 NOTE — Assessment & Plan Note (Addendum)
Encouraged pt to quit; see AVS; patient overdue for chest CT; done in 2015; I entered orders in October 2016 and again in June 2017; patient still has not gone; perhaps pulmonologist can convince her to go

## 2016-07-12 NOTE — Assessment & Plan Note (Signed)
Cardiologist is Dr. Mariah MillingGollan

## 2016-07-12 NOTE — Patient Instructions (Addendum)
I do encourage you to quit smoking Call [phone] to sign up for smoking cessation classes You can call 1-800-QUIT-NOW to talk with a smoking cessation coach We'll have you see the pulmonologist Return for labs (fasting) We'll send off the forms for personal care services

## 2016-07-12 NOTE — Assessment & Plan Note (Addendum)
Limits abililty to do IADLs; urged patient to quit smoking; explained to her in no uncertain terms that her cigarettes were killing her; recommended referral to pulmonologist

## 2016-07-12 NOTE — Assessment & Plan Note (Signed)
Check labs 

## 2016-07-13 LAB — URINALYSIS W MICROSCOPIC + REFLEX CULTURE
BILIRUBIN URINE: NEGATIVE
CRYSTALS: NONE SEEN [HPF]
Casts: NONE SEEN [LPF]
Glucose, UA: NEGATIVE
HGB URINE DIPSTICK: NEGATIVE
KETONES UR: NEGATIVE
Nitrite: POSITIVE — AB
SQUAMOUS EPITHELIAL / LPF: NONE SEEN [HPF] (ref <=5)
Specific Gravity, Urine: 1.018 (ref 1.001–1.035)
Yeast: NONE SEEN [HPF]
pH: 6.5 (ref 5.0–8.0)

## 2016-07-13 LAB — COMPLETE METABOLIC PANEL WITH GFR
ALT: 9 U/L (ref 6–29)
AST: 16 U/L (ref 10–35)
Albumin: 3.8 g/dL (ref 3.6–5.1)
Alkaline Phosphatase: 44 U/L (ref 33–130)
BUN: 31 mg/dL — ABNORMAL HIGH (ref 7–25)
CALCIUM: 9.1 mg/dL (ref 8.6–10.4)
CHLORIDE: 104 mmol/L (ref 98–110)
CO2: 25 mmol/L (ref 20–31)
Creat: 1.29 mg/dL — ABNORMAL HIGH (ref 0.60–0.93)
GFR, EST AFRICAN AMERICAN: 46 mL/min — AB (ref >=60)
GFR, EST NON AFRICAN AMERICAN: 39 mL/min — AB (ref >=60)
Glucose, Bld: 75 mg/dL (ref 65–99)
POTASSIUM: 4.4 mmol/L (ref 3.5–5.3)
Sodium: 142 mmol/L (ref 135–146)
Total Bilirubin: 0.3 mg/dL (ref 0.2–1.2)
Total Protein: 7.6 g/dL (ref 6.1–8.1)

## 2016-07-13 LAB — LIPID PANEL
CHOL/HDL RATIO: 2.3 ratio (ref <=5.0)
CHOLESTEROL: 168 mg/dL (ref 125–200)
HDL: 72 mg/dL (ref >=46)
LDL CALC: 76 mg/dL (ref <130)
Triglycerides: 102 mg/dL (ref <150)
VLDL: 20 mg/dL (ref <30)

## 2016-07-14 ENCOUNTER — Telehealth: Payer: Self-pay | Admitting: Family Medicine

## 2016-07-14 MED ORDER — CIPROFLOXACIN HCL 250 MG PO TABS: 250.0000 mg | ORAL_TABLET | Freq: Two times a day (BID) | ORAL | 0 refills | Status: DC

## 2016-07-14 NOTE — Telephone Encounter (Signed)
I talked with patient; she is going to try to quit smoking; she feels better when she doesn't smoke she said Start antibiotic for bladder infection today Pick up vitamin B12; she'll get that Wed when her check comes in, but she'll start ABX today Kidneys not working as well as before; avoid motrin, etc (she's allergic anyway); increase water to 6-8 glasses a day Cholesterol favorable

## 2016-07-15 LAB — URINE CULTURE

## 2016-07-25 ENCOUNTER — Telehealth: Payer: Self-pay | Admitting: Family Medicine

## 2016-07-25 NOTE — Telephone Encounter (Signed)
She has bad COPD and should be evaluated by a doctor Urge her to go to urgent care or see someone here in the morning

## 2016-07-25 NOTE — Telephone Encounter (Signed)
Pt notified, keeps stating she has an appt with you next week.

## 2016-07-25 NOTE — Telephone Encounter (Signed)
Requesting return call. Patient has cold and is coughing up phlegm I offered her an appointment for today with [facility] but due to transportion she was not able to come. I did schedule appointment for Tuesday but she is requesting return call would like to know if she have to be concerned about it.

## 2016-07-26 ENCOUNTER — Emergency Department
Admission: EM | Admit: 2016-07-26 | Discharge: 2016-07-26 | Disposition: A | Discharge status: Home / Self Care | Payer: Medicare Other | Attending: Emergency Medicine | Admitting: Emergency Medicine

## 2016-07-26 DIAGNOSIS — R21 Rash and other nonspecific skin eruption: Secondary | ICD-10-CM | POA: Diagnosis present

## 2016-07-26 DIAGNOSIS — B029 Zoster without complications: Secondary | ICD-10-CM | POA: Diagnosis not present

## 2016-07-26 DIAGNOSIS — I10 Essential (primary) hypertension: Secondary | ICD-10-CM | POA: Insufficient documentation

## 2016-07-26 DIAGNOSIS — I2510 Atherosclerotic heart disease of native coronary artery without angina pectoris: Secondary | ICD-10-CM | POA: Diagnosis not present

## 2016-07-26 DIAGNOSIS — J449 Chronic obstructive pulmonary disease, unspecified: Secondary | ICD-10-CM | POA: Diagnosis not present

## 2016-07-26 DIAGNOSIS — Z79899 Other long term (current) drug therapy: Secondary | ICD-10-CM | POA: Insufficient documentation

## 2016-07-26 DIAGNOSIS — F17210 Nicotine dependence, cigarettes, uncomplicated: Secondary | ICD-10-CM | POA: Insufficient documentation

## 2016-07-26 DIAGNOSIS — Z7982 Long term (current) use of aspirin: Secondary | ICD-10-CM | POA: Diagnosis not present

## 2016-07-26 DIAGNOSIS — R059 Cough, unspecified: Secondary | ICD-10-CM

## 2016-07-26 DIAGNOSIS — R05 Cough: Secondary | ICD-10-CM | POA: Diagnosis not present

## 2016-07-26 MED ORDER — VALACYCLOVIR HCL 1 G PO TABS: 1000.0000 mg | ORAL_TABLET | Freq: Three times a day (TID) | ORAL | 0 refills | Status: AC

## 2016-07-26 MED ORDER — TRAMADOL HCL 50 MG PO TABS: 50.0000 mg | ORAL_TABLET | Freq: Four times a day (QID) | ORAL | 0 refills | Status: DC | PRN
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
## 2016-07-26 NOTE — ED Triage Notes (Addendum)
Rash to right side of breast and around right side of back, causing pain to side per patient. States pain is burning in nature. Denies abdominal aching, cramping, etc. C/o cough, not productive X 3 days. Blistering multiple lesions, reddened to right side of breast and radiating to right back.

## 2016-07-30 ENCOUNTER — Ambulatory Visit (INDEPENDENT_AMBULATORY_CARE_PROVIDER_SITE_OTHER): Payer: Medicare Other | Admitting: Family Medicine

## 2016-07-30 ENCOUNTER — Telehealth: Payer: Self-pay | Admitting: Family Medicine

## 2016-07-30 ENCOUNTER — Ambulatory Visit
Admission: RE | Admit: 2016-07-30 | Discharge: 2016-07-30 | Disposition: A | Discharge status: Home / Self Care | Payer: Medicare Other | Source: Ambulatory Visit | Attending: Family Medicine | Admitting: Family Medicine

## 2016-07-30 DIAGNOSIS — B029 Zoster without complications: Secondary | ICD-10-CM | POA: Insufficient documentation

## 2016-07-30 DIAGNOSIS — R042 Hemoptysis: Secondary | ICD-10-CM | POA: Diagnosis not present

## 2016-07-30 DIAGNOSIS — J209 Acute bronchitis, unspecified: Secondary | ICD-10-CM

## 2016-07-30 DIAGNOSIS — J42 Unspecified chronic bronchitis: Secondary | ICD-10-CM | POA: Diagnosis not present

## 2016-07-30 DIAGNOSIS — I700 Atherosclerosis of aorta: Secondary | ICD-10-CM | POA: Insufficient documentation

## 2016-07-30 DIAGNOSIS — I2510 Atherosclerotic heart disease of native coronary artery without angina pectoris: Secondary | ICD-10-CM | POA: Diagnosis not present

## 2016-07-30 DIAGNOSIS — I517 Cardiomegaly: Secondary | ICD-10-CM | POA: Insufficient documentation

## 2016-07-30 DIAGNOSIS — I878 Other specified disorders of veins: Secondary | ICD-10-CM | POA: Diagnosis not present

## 2016-07-30 DIAGNOSIS — R05 Cough: Secondary | ICD-10-CM | POA: Diagnosis not present

## 2016-07-30 DIAGNOSIS — I2583 Coronary atherosclerosis due to lipid rich plaque: Secondary | ICD-10-CM | POA: Diagnosis not present

## 2016-07-30 MED ORDER — POTASSIUM CHLORIDE ER 10 MEQ PO TBCR: 10.0000 meq | EXTENDED_RELEASE_TABLET | Freq: Every day | ORAL | 0 refills | Status: DC

## 2016-07-30 MED ORDER — FUROSEMIDE 20 MG PO TABS: 20.0000 mg | ORAL_TABLET | Freq: Every day | ORAL | 0 refills | Status: DC

## 2016-07-30 MED ORDER — GABAPENTIN 100 MG PO CAPS: ORAL_CAPSULE | ORAL | 0 refills | Status: DC

## 2016-07-30 MED ORDER — DOXYCYCLINE HYCLATE 100 MG PO TABS: 100.0000 mg | ORAL_TABLET | Freq: Two times a day (BID) | ORAL | 0 refills | Status: AC

## 2016-07-30 MED ORDER — TRAMADOL HCL 50 MG PO TABS: 50.0000 mg | ORAL_TABLET | Freq: Four times a day (QID) | ORAL | 0 refills | Status: AC | PRN

## 2016-07-30 MED ORDER — BENZONATATE 100 MG PO CAPS: 100.0000 mg | ORAL_CAPSULE | Freq: Three times a day (TID) | ORAL | 0 refills | Status: DC | PRN

## 2016-07-30 MED ORDER — DOXYCYCLINE HYCLATE 100 MG PO TABS: 100.0000 mg | ORAL_TABLET | Freq: Two times a day (BID) | ORAL | 0 refills | Status: DC
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
## 2016-07-30 NOTE — Telephone Encounter (Signed)
I called pt Start fluid pill and potassium pill Stay away from salt; if getting worse, go to ER [facility]; they don't know when they're coming back up; sister said send Rx to [facility] across the [address] in [place]

## 2016-07-30 NOTE — Assessment & Plan Note (Signed)
Start doxy, get CXR

## 2016-07-30 NOTE — Assessment & Plan Note (Addendum)
Continue valacyclovir until finished; explained she cannot spread shingles to other people; avoid exposure to pregnant women and chemo patients; also avoid being around babies and infants under a year of age

## 2016-07-30 NOTE — Patient Instructions (Addendum)
Finish out the valacyclovir as directed Use the tramadol if needed for pain Start the new gabapentin which is helpful for nerve irritation Stay away from suspectible patients (babies, pregnant women, patients getting chemotherapy) Please go across the street and get the chest xray done We'll schedule the chest CT Start the antibiotics Please do eat yogurt daily or take a probiotic daily for the next month or two We want to replace the healthy germs in the gut If you notice foul, watery diarrhea in the next two months, schedule an appointment RIGHT AWAY

## 2016-07-30 NOTE — Assessment & Plan Note (Signed)
Chest CT 

## 2016-08-17 ENCOUNTER — Telehealth: Payer: Self-pay | Admitting: Family Medicine

## 2016-08-17 NOTE — Telephone Encounter (Signed)
Please make sure patient gets her chest CT done soon Thank you

## 2016-08-19 ENCOUNTER — Other Ambulatory Visit: Payer: Self-pay | Admitting: Family Medicine

## 2016-08-19 NOTE — Telephone Encounter (Signed)
Patient has been scheduled for 08/23/16 @ 1:30pm in [place]. Patient has been informed.

## 2016-08-22 ENCOUNTER — Other Ambulatory Visit: Payer: Self-pay

## 2016-08-22 DIAGNOSIS — Z298 Encounter for other specified prophylactic measures: Secondary | ICD-10-CM

## 2016-08-22 DIAGNOSIS — I10 Essential (primary) hypertension: Secondary | ICD-10-CM

## 2016-08-23 ENCOUNTER — Other Ambulatory Visit
Admission: RE | Admit: 2016-08-23 | Discharge: 2016-08-23 | Disposition: A | Discharge status: Home / Self Care | Payer: Medicare Other | Source: Ambulatory Visit | Attending: Family Medicine | Admitting: Family Medicine

## 2016-08-23 ENCOUNTER — Ambulatory Visit
Admission: RE | Admit: 2016-08-23 | Discharge: 2016-08-23 | Disposition: A | Discharge status: Home / Self Care | Payer: Medicare Other | Source: Ambulatory Visit | Attending: Family Medicine | Admitting: Family Medicine

## 2016-10-18 ENCOUNTER — Ambulatory Visit: Payer: Medicare Other | Admitting: Family Medicine

## 2016-10-19 ENCOUNTER — Telehealth: Payer: Self-pay | Admitting: Family Medicine

## 2016-10-19 DIAGNOSIS — R042 Hemoptysis: Secondary | ICD-10-CM

## 2016-10-19 DIAGNOSIS — J209 Acute bronchitis, unspecified: Secondary | ICD-10-CM

## 2016-10-19 DIAGNOSIS — J42 Unspecified chronic bronchitis: Principal | ICD-10-CM

## 2016-10-19 NOTE — Telephone Encounter (Signed)
Please follow-up on the chest CT that was ordered, supposed to have been done in December; I never received results I'd like her to have that done before her next appt with me if at all possible Thank you

## 2016-10-21 NOTE — Telephone Encounter (Signed)
Thank you for looking into this; as long as the patient gets this CT scan, I'll be content; thank you
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
## 2016-10-21 NOTE — Addendum Note (Signed)
Addended by: Rickey BarbaraONE, LATISHA A on: 10/21/2016 08:00 AM   Modules accepted: Orders

## 2016-10-21 NOTE — Telephone Encounter (Signed)
I tried to contact this patient to let her know that she has been scheduled to have her CT Chest W/ Contrast this Wednesday at 1pm at [facility], but there was no answer. A message was left for her with that information and scheduling's numbr if she cannot make that appt.

## 2016-10-23 ENCOUNTER — Ambulatory Visit: Admission: RE | Admit: 2016-10-23 | Payer: Medicare Other | Source: Ambulatory Visit

## 2016-10-29 ENCOUNTER — Emergency Department: Payer: Medicare Other

## 2016-10-29 ENCOUNTER — Inpatient Hospital Stay (HOSPITAL_COMMUNITY)
Admit: 2016-10-29 | Discharge: 2016-10-29 | Disposition: A | Discharge status: Home / Self Care | Payer: Medicare Other | Attending: Internal Medicine | Admitting: Internal Medicine

## 2016-10-29 ENCOUNTER — Encounter: Payer: Self-pay | Admitting: Emergency Medicine

## 2016-10-29 ENCOUNTER — Inpatient Hospital Stay
Admission: EM | Admit: 2016-10-29 | Discharge: 2016-11-01 | DRG: 291 | Disposition: A | Discharge status: Home / Self Care | Payer: Medicare Other | Attending: Internal Medicine | Admitting: Internal Medicine

## 2016-10-29 ENCOUNTER — Ambulatory Visit: Payer: Medicare Other | Admitting: Family Medicine

## 2016-10-29 DIAGNOSIS — Z8261 Family history of arthritis: Secondary | ICD-10-CM

## 2016-10-29 DIAGNOSIS — R011 Cardiac murmur, unspecified: Secondary | ICD-10-CM | POA: Diagnosis present

## 2016-10-29 DIAGNOSIS — Z8249 Family history of ischemic heart disease and other diseases of the circulatory system: Secondary | ICD-10-CM | POA: Diagnosis not present

## 2016-10-29 DIAGNOSIS — R262 Difficulty in walking, not elsewhere classified: Secondary | ICD-10-CM

## 2016-10-29 DIAGNOSIS — J440 Chronic obstructive pulmonary disease with acute lower respiratory infection: Secondary | ICD-10-CM | POA: Diagnosis present

## 2016-10-29 DIAGNOSIS — I447 Left bundle-branch block, unspecified: Secondary | ICD-10-CM | POA: Diagnosis present

## 2016-10-29 DIAGNOSIS — J9601 Acute respiratory failure with hypoxia: Secondary | ICD-10-CM | POA: Diagnosis present

## 2016-10-29 DIAGNOSIS — Z9114 Patient's other noncompliance with medication regimen: Secondary | ICD-10-CM | POA: Diagnosis not present

## 2016-10-29 DIAGNOSIS — J441 Chronic obstructive pulmonary disease with (acute) exacerbation: Secondary | ICD-10-CM | POA: Diagnosis present

## 2016-10-29 DIAGNOSIS — Z79899 Other long term (current) drug therapy: Secondary | ICD-10-CM | POA: Diagnosis not present

## 2016-10-29 DIAGNOSIS — Z888 Allergy status to other drugs, medicaments and biological substances status: Secondary | ICD-10-CM

## 2016-10-29 DIAGNOSIS — Z7951 Long term (current) use of inhaled steroids: Secondary | ICD-10-CM

## 2016-10-29 DIAGNOSIS — Z833 Family history of diabetes mellitus: Secondary | ICD-10-CM

## 2016-10-29 DIAGNOSIS — E785 Hyperlipidemia, unspecified: Secondary | ICD-10-CM | POA: Diagnosis not present

## 2016-10-29 DIAGNOSIS — I472 Ventricular tachycardia: Secondary | ICD-10-CM | POA: Diagnosis present

## 2016-10-29 DIAGNOSIS — Z716 Tobacco abuse counseling: Secondary | ICD-10-CM | POA: Diagnosis not present

## 2016-10-29 DIAGNOSIS — I130 Hypertensive heart and chronic kidney disease with heart failure and stage 1 through stage 4 chronic kidney disease, or unspecified chronic kidney disease: Secondary | ICD-10-CM | POA: Diagnosis not present

## 2016-10-29 DIAGNOSIS — E876 Hypokalemia: Secondary | ICD-10-CM | POA: Diagnosis not present

## 2016-10-29 DIAGNOSIS — I252 Old myocardial infarction: Secondary | ICD-10-CM

## 2016-10-29 DIAGNOSIS — F17210 Nicotine dependence, cigarettes, uncomplicated: Secondary | ICD-10-CM | POA: Diagnosis not present

## 2016-10-29 DIAGNOSIS — I272 Pulmonary hypertension, unspecified: Secondary | ICD-10-CM

## 2016-10-29 DIAGNOSIS — Z8619 Personal history of other infectious and parasitic diseases: Secondary | ICD-10-CM

## 2016-10-29 DIAGNOSIS — I350 Nonrheumatic aortic (valve) stenosis: Secondary | ICD-10-CM | POA: Diagnosis not present

## 2016-10-29 DIAGNOSIS — I2510 Atherosclerotic heart disease of native coronary artery without angina pectoris: Secondary | ICD-10-CM | POA: Diagnosis present

## 2016-10-29 DIAGNOSIS — I10 Essential (primary) hypertension: Secondary | ICD-10-CM | POA: Diagnosis present

## 2016-10-29 DIAGNOSIS — R0902 Hypoxemia: Secondary | ICD-10-CM

## 2016-10-29 DIAGNOSIS — I5043 Acute on chronic combined systolic (congestive) and diastolic (congestive) heart failure: Secondary | ICD-10-CM | POA: Diagnosis not present

## 2016-10-29 DIAGNOSIS — Z886 Allergy status to analgesic agent status: Secondary | ICD-10-CM

## 2016-10-29 DIAGNOSIS — Z7982 Long term (current) use of aspirin: Secondary | ICD-10-CM

## 2016-10-29 DIAGNOSIS — R0602 Shortness of breath: Secondary | ICD-10-CM | POA: Diagnosis not present

## 2016-10-29 DIAGNOSIS — J810 Acute pulmonary edema: Secondary | ICD-10-CM

## 2016-10-29 DIAGNOSIS — Z90710 Acquired absence of both cervix and uterus: Secondary | ICD-10-CM

## 2016-10-29 DIAGNOSIS — Z96659 Presence of unspecified artificial knee joint: Secondary | ICD-10-CM | POA: Diagnosis not present

## 2016-10-29 DIAGNOSIS — J209 Acute bronchitis, unspecified: Secondary | ICD-10-CM | POA: Diagnosis present

## 2016-10-29 DIAGNOSIS — N183 Chronic kidney disease, stage 3 (moderate): Secondary | ICD-10-CM | POA: Diagnosis present

## 2016-10-29 DIAGNOSIS — Z720 Tobacco use: Secondary | ICD-10-CM | POA: Diagnosis present

## 2016-10-29 DIAGNOSIS — I5031 Acute diastolic (congestive) heart failure: Secondary | ICD-10-CM

## 2016-10-29 DIAGNOSIS — Z823 Family history of stroke: Secondary | ICD-10-CM | POA: Diagnosis not present

## 2016-10-29 DIAGNOSIS — I509 Heart failure, unspecified: Secondary | ICD-10-CM | POA: Diagnosis not present

## 2016-10-29 HISTORY — DX: Burn of unspecified body region, unspecified degree: T30.0

## 2016-10-29 HISTORY — DX: Chronic combined systolic (congestive) and diastolic (congestive) heart failure: I50.42

## 2016-10-29 LAB — BRAIN NATRIURETIC PEPTIDE: B NATRIURETIC PEPTIDE 5: 232 pg/mL — AB (ref 0.0–100.0)

## 2016-10-29 LAB — TSH: TSH: 0.734 u[IU]/mL (ref 0.350–4.500)

## 2016-10-29 LAB — BASIC METABOLIC PANEL
ANION GAP: 9 (ref 5–15)
Anion gap: 9 (ref 5–15)
BUN: 31 mg/dL — AB (ref 6–20)
BUN: 33 mg/dL — AB (ref 6–20)
CO2: 24 mmol/L (ref 22–32)
CO2: 27 mmol/L (ref 22–32)
CREATININE: 1.08 mg/dL — AB (ref 0.44–1.00)
Calcium: 8.4 mg/dL — ABNORMAL LOW (ref 8.9–10.3)
Calcium: 8.4 mg/dL — ABNORMAL LOW (ref 8.9–10.3)
Chloride: 104 mmol/L (ref 101–111)
Chloride: 102 mmol/L (ref 101–111)
Creatinine, Ser: 1.34 mg/dL — ABNORMAL HIGH (ref 0.44–1.00)
GFR, EST AFRICAN AMERICAN: 55 mL/min — AB (ref >60)
GFR, EST AFRICAN AMERICAN: 42 mL/min — AB (ref >60)
GFR, EST NON AFRICAN AMERICAN: 48 mL/min — AB (ref >60)
GFR, EST NON AFRICAN AMERICAN: 37 mL/min — AB (ref >60)
Glucose, Bld: 91 mg/dL (ref 65–99)
Glucose, Bld: 127 mg/dL — ABNORMAL HIGH (ref 65–99)
POTASSIUM: 3.5 mmol/L (ref 3.5–5.1)
POTASSIUM: 4.1 mmol/L (ref 3.5–5.1)
SODIUM: 138 mmol/L (ref 135–145)
Sodium: 137 mmol/L (ref 135–145)

## 2016-10-29 LAB — CBC
HCT: 37.5 % (ref 35.0–47.0)
Hemoglobin: 12.7 g/dL (ref 12.0–16.0)
MCH: 31.3 pg (ref 26.0–34.0)
MCHC: 33.8 g/dL (ref 32.0–36.0)
MCV: 92.6 fL (ref 80.0–100.0)
PLATELETS: 195 10*3/uL (ref 150–440)
RBC: 4.05 MIL/uL (ref 3.80–5.20)
RDW: 16 % — AB (ref 11.5–14.5)
WBC: 12.3 10*3/uL — ABNORMAL HIGH (ref 3.6–11.0)

## 2016-10-29 LAB — TROPONIN I: TROPONIN I: 0.04 ng/mL — AB (ref <0.03)

## 2016-10-29 LAB — INFLUENZA PANEL BY PCR (TYPE A & B)
INFLAPCR: NEGATIVE
INFLBPCR: NEGATIVE

## 2016-10-29 LAB — MAGNESIUM: MAGNESIUM: 2 mg/dL (ref 1.7–2.4)

## 2016-10-29 MED ORDER — BUDESONIDE 0.5 MG/2ML IN SUSP
0.5000 mg | Freq: Two times a day (BID) | RESPIRATORY_TRACT | Status: DC
  Administered 2016-10-29 – 2016-11-01 (×6): 0.5 mg via RESPIRATORY_TRACT
  Filled 2016-10-29 (×7): qty 2

## 2016-10-29 MED ORDER — POTASSIUM CHLORIDE CRYS ER 20 MEQ PO TBCR
20.0000 meq | EXTENDED_RELEASE_TABLET | Freq: Two times a day (BID) | ORAL | Status: DC
  Administered 2016-10-29 – 2016-10-30 (×2): 20 meq via ORAL
  Filled 2016-10-29 (×2): qty 1

## 2016-10-29 MED ORDER — ONDANSETRON HCL 4 MG/2ML IJ SOLN: 4.0000 mg | Freq: Four times a day (QID) | INTRAMUSCULAR | Status: DC | PRN

## 2016-10-29 MED ORDER — FUROSEMIDE 10 MG/ML IJ SOLN: 40.0000 mg | Freq: Two times a day (BID) | INTRAMUSCULAR | Status: DC

## 2016-10-29 MED ORDER — ASPIRIN EC 81 MG PO TBEC
81.0000 mg | DELAYED_RELEASE_TABLET | Freq: Every day | ORAL | Status: DC
  Administered 2016-10-30 – 2016-11-01 (×3): 81 mg via ORAL
  Filled 2016-10-29 (×3): qty 1

## 2016-10-29 MED ORDER — IPRATROPIUM-ALBUTEROL 0.5-2.5 (3) MG/3ML IN SOLN
3.0000 mL | Freq: Four times a day (QID) | RESPIRATORY_TRACT | Status: DC
  Administered 2016-10-29 – 2016-11-01 (×11): 3 mL via RESPIRATORY_TRACT
  Filled 2016-10-29 (×13): qty 3

## 2016-10-29 MED ORDER — POTASSIUM CHLORIDE CRYS ER 20 MEQ PO TBCR
40.0000 meq | EXTENDED_RELEASE_TABLET | Freq: Once | ORAL | Status: AC
  Administered 2016-10-29: 40 meq via ORAL
  Filled 2016-10-29: qty 2

## 2016-10-29 MED ORDER — ONDANSETRON HCL 4 MG PO TABS: 4.0000 mg | ORAL_TABLET | Freq: Four times a day (QID) | ORAL | Status: DC | PRN

## 2016-10-29 MED ORDER — ACETAMINOPHEN 650 MG RE SUPP: 650.0000 mg | Freq: Four times a day (QID) | RECTAL | Status: DC | PRN

## 2016-10-29 MED ORDER — METOPROLOL TARTRATE 25 MG PO TABS
25.0000 mg | ORAL_TABLET | Freq: Two times a day (BID) | ORAL | Status: DC
  Administered 2016-10-29: 25 mg via ORAL
  Filled 2016-10-29: qty 1

## 2016-10-29 MED ORDER — ACETAMINOPHEN 325 MG PO TABS
650.0000 mg | ORAL_TABLET | Freq: Four times a day (QID) | ORAL | Status: DC | PRN
  Administered 2016-10-29 – 2016-10-30 (×3): 650 mg via ORAL
  Filled 2016-10-29 (×3): qty 2

## 2016-10-29 MED ORDER — ENOXAPARIN SODIUM 40 MG/0.4ML ~~LOC~~ SOLN
40.0000 mg | SUBCUTANEOUS | Status: DC
  Administered 2016-10-29 – 2016-11-01 (×4): 40 mg via SUBCUTANEOUS
  Filled 2016-10-29 (×4): qty 0.4

## 2016-10-29 MED ORDER — FUROSEMIDE 10 MG/ML IJ SOLN
40.0000 mg | Freq: Once | INTRAMUSCULAR | Status: AC
  Administered 2016-10-29: 40 mg via INTRAVENOUS
  Filled 2016-10-29: qty 4

## 2016-10-29 MED ORDER — AMLODIPINE BESYLATE 10 MG PO TABS
10.0000 mg | ORAL_TABLET | Freq: Every day | ORAL | Status: DC
  Administered 2016-10-30 – 2016-11-01 (×3): 10 mg via ORAL
  Filled 2016-10-29 (×3): qty 1

## 2016-10-29 MED ORDER — CARVEDILOL 6.25 MG PO TABS
6.2500 mg | ORAL_TABLET | Freq: Two times a day (BID) | ORAL | Status: DC
  Administered 2016-10-29 – 2016-11-01 (×6): 6.25 mg via ORAL
  Filled 2016-10-29 (×7): qty 1
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
## 2016-10-29 NOTE — Progress Notes (Signed)
[place] responded to an OR for Ad for a Pt in [place] who wanted to complete an Ad. [place] visited and met with the Pt, [address] offered instruction on who to complete the Ad, Pt was satisfied with the information provided, but the Pt requested more time to go over the material and would contact the [facility] when she is ready complete it, probably, tomorrow.     10/29/16 1900  Clinical Encounter Type  Visited With Patient  Visit Type Initial;ED  Referral From Nurse  Consult/Referral To Chaplain  Spiritual Encounters  Spiritual Needs Literature;Brochure;Other (Comment)
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
## 2016-10-29 NOTE — ED Notes (Signed)
Lab called to assist with blood draw 

## 2016-10-29 NOTE — ED Notes (Signed)
Pt very difficult stick, IV will not draw back and lab unable to obtain blood specimen.  Femoral stick performed via ED MD at this time.
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
## 2016-10-30 DIAGNOSIS — I272 Pulmonary hypertension, unspecified: Secondary | ICD-10-CM

## 2016-10-30 DIAGNOSIS — R0602 Shortness of breath: Secondary | ICD-10-CM

## 2016-10-30 DIAGNOSIS — N183 Chronic kidney disease, stage 3 (moderate): Secondary | ICD-10-CM | POA: Diagnosis not present

## 2016-10-30 DIAGNOSIS — R0902 Hypoxemia: Secondary | ICD-10-CM | POA: Diagnosis not present

## 2016-10-30 DIAGNOSIS — I10 Essential (primary) hypertension: Secondary | ICD-10-CM | POA: Diagnosis not present

## 2016-10-30 DIAGNOSIS — I5031 Acute diastolic (congestive) heart failure: Secondary | ICD-10-CM | POA: Diagnosis not present

## 2016-10-30 DIAGNOSIS — J441 Chronic obstructive pulmonary disease with (acute) exacerbation: Secondary | ICD-10-CM | POA: Diagnosis not present

## 2016-10-30 DIAGNOSIS — I130 Hypertensive heart and chronic kidney disease with heart failure and stage 1 through stage 4 chronic kidney disease, or unspecified chronic kidney disease: Secondary | ICD-10-CM | POA: Diagnosis not present

## 2016-10-30 DIAGNOSIS — I472 Ventricular tachycardia: Secondary | ICD-10-CM | POA: Diagnosis not present

## 2016-10-30 DIAGNOSIS — J440 Chronic obstructive pulmonary disease with acute lower respiratory infection: Secondary | ICD-10-CM | POA: Diagnosis not present

## 2016-10-30 DIAGNOSIS — Z720 Tobacco use: Secondary | ICD-10-CM | POA: Diagnosis not present

## 2016-10-30 DIAGNOSIS — I5043 Acute on chronic combined systolic (congestive) and diastolic (congestive) heart failure: Secondary | ICD-10-CM | POA: Diagnosis not present

## 2016-10-30 DIAGNOSIS — J9601 Acute respiratory failure with hypoxia: Secondary | ICD-10-CM | POA: Diagnosis not present

## 2016-10-30 LAB — BASIC METABOLIC PANEL
Anion gap: 6 (ref 5–15)
BUN: 33 mg/dL — AB (ref 6–20)
CO2: 29 mmol/L (ref 22–32)
Calcium: 8.5 mg/dL — ABNORMAL LOW (ref 8.9–10.3)
Chloride: 106 mmol/L (ref 101–111)
Creatinine, Ser: 1.29 mg/dL — ABNORMAL HIGH (ref 0.44–1.00)
GFR calc Af Amer: 44 mL/min — ABNORMAL LOW (ref >60)
GFR calc non Af Amer: 38 mL/min — ABNORMAL LOW (ref >60)
GLUCOSE: 97 mg/dL (ref 65–99)
POTASSIUM: 4.1 mmol/L (ref 3.5–5.1)
Sodium: 141 mmol/L (ref 135–145)

## 2016-10-30 LAB — CBC
HEMATOCRIT: 36.5 % (ref 35.0–47.0)
Hemoglobin: 12.5 g/dL (ref 12.0–16.0)
MCH: 31.7 pg (ref 26.0–34.0)
MCHC: 34.1 g/dL (ref 32.0–36.0)
MCV: 93.1 fL (ref 80.0–100.0)
Platelets: 188 10*3/uL (ref 150–440)
RBC: 3.92 MIL/uL (ref 3.80–5.20)
RDW: 16.1 % — AB (ref 11.5–14.5)
WBC: 8.2 10*3/uL (ref 3.6–11.0)

## 2016-10-30 LAB — ECHOCARDIOGRAM COMPLETE
Height: 66 in
Weight: 2368 oz

## 2016-10-30 LAB — LIPID PANEL
Cholesterol: 143 mg/dL (ref 0–200)
HDL: 61 mg/dL (ref >40)
LDL CALC: 63 mg/dL (ref 0–99)
TRIGLYCERIDES: 95 mg/dL (ref <150)
Total CHOL/HDL Ratio: 2.3 RATIO
VLDL: 19 mg/dL (ref 0–40)

## 2016-10-30 LAB — MAGNESIUM: MAGNESIUM: 2.1 mg/dL (ref 1.7–2.4)

## 2016-10-30 MED ORDER — FUROSEMIDE 10 MG/ML IJ SOLN
20.0000 mg | Freq: Two times a day (BID) | INTRAMUSCULAR | Status: DC
  Administered 2016-10-30 – 2016-10-31 (×2): 20 mg via INTRAVENOUS
  Filled 2016-10-30 (×2): qty 2

## 2016-10-30 MED ORDER — TRAZODONE HCL 50 MG PO TABS
50.0000 mg | ORAL_TABLET | Freq: Every evening | ORAL | Status: DC | PRN
  Administered 2016-10-31: 50 mg via ORAL
  Filled 2016-10-30 (×2): qty 1

## 2016-10-30 MED ORDER — FUROSEMIDE 10 MG/ML IJ SOLN
20.0000 mg | Freq: Two times a day (BID) | INTRAMUSCULAR | Status: DC
  Administered 2016-10-30: 20 mg via INTRAVENOUS
  Filled 2016-10-30: qty 2

## 2016-10-30 MED ORDER — AZITHROMYCIN 500 MG PO TABS
500.0000 mg | ORAL_TABLET | Freq: Every day | ORAL | Status: DC
  Administered 2016-10-30 – 2016-11-01 (×3): 500 mg via ORAL
  Filled 2016-10-30 (×3): qty 1

## 2016-10-30 MED ORDER — GUAIFENESIN ER 600 MG PO TB12
600.0000 mg | ORAL_TABLET | Freq: Two times a day (BID) | ORAL | Status: DC
  Administered 2016-10-30 – 2016-11-01 (×5): 600 mg via ORAL
  Filled 2016-10-30 (×5): qty 1

## 2016-10-30 MED ORDER — METHYLPREDNISOLONE SODIUM SUCC 125 MG IJ SOLR
60.0000 mg | Freq: Four times a day (QID) | INTRAMUSCULAR | Status: DC
  Administered 2016-10-30 – 2016-10-31 (×4): 60 mg via INTRAVENOUS
  Filled 2016-10-30 (×4): qty 2

## 2016-10-30 MED ORDER — GUAIFENESIN-DM 100-10 MG/5ML PO SYRP
15.0000 mL | ORAL_SOLUTION | ORAL | Status: DC | PRN
  Administered 2016-10-30 – 2016-11-01 (×2): 15 mL via ORAL
  Filled 2016-10-30 (×2): qty 15

## 2016-10-30 NOTE — Progress Notes (Signed)
Initial HF Clinic appointment scheduled on November 06, 2016 at 11:30am. Thank you.
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
## 2016-10-30 NOTE — Progress Notes (Signed)
Patient had 6bt run Kirby FunkVtach, MD notified. Magnesium check ordered.

## 2016-10-30 NOTE — Discharge Instructions (Signed)
Heart Failure Clinic appointment on November 06, 2016 at 11:30am with Helen Kindredina Besan Ketchem, FNP. Please call [phone] to reschedule.
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
## 2016-10-30 NOTE — Care Management Important Message (Signed)
Important Message  Patient Details  Name: Helen Moore MRN: [id number] Date of Birth: [birth year]   Medicare Important Message Given:  Yes  Initial signed IM printed from Epic and given to patient.    Eber HongGreene, Courage Biglow R, RN 10/30/2016, 4:06 PM

## 2016-10-30 NOTE — Evaluation (Signed)
Physical Therapy Evaluation Patient Details Name: Helen Moore MRN: [id number] DOB: 1935-02-13 Today's Date: 10/30/2016   History of Present Illness  Patient is a 82 y/o female noted to have hypoxic at PCP, noted to have CHF on chest x-ray. Does not use O2 at home, but currently on 3L   Clinical Impression  Patient admitted for shortness of breath and persistent cough. She demonstrates decreased O2 sats on 3L of O2 (97-91%) throughout ambulation, though does not typically use O2. She does have one slight loss of balance to her R, which she recovers from and denies history of falls. She reports she is at her baseline mobility wise, but does not appear to be relative to her report. Decreased toe off, decreased stride length bilaterally indicative of falls risk. Would recommend possible use of RW in follow up session and HHPT for home safety evaluation when medically appropriate for discharge.     Follow Up Recommendations Home health PT (Lung works or other pulmonary rehab when appropriate)    Equipment Recommendations  Rolling walker with 5" wheels    Recommendations for Other Services       Precautions / Restrictions Precautions Precautions: Fall Restrictions Weight Bearing Restrictions: No      Mobility  Bed Mobility Overal bed mobility: Independent             General bed mobility comments: No deficits in bed mobility.   Transfers Overall transfer level: Needs assistance   Transfers: Sit to/from Stand Sit to Stand: Supervision         General transfer comment: No loss of balance of use of hands in sit to stand transfer.   Ambulation/Gait Ambulation/Gait assistance: Min guard Ambulation Distance (Feet): 250 Feet Assistive device: None Gait Pattern/deviations: Decreased step length - right;Decreased step length - left   Gait velocity interpretation: <1.8 ft/sec, indicative of risk for recurrent falls General Gait Details: Patient has decreased toe off  bilaterally, decreased stride length, she does lose balance x 1, though this may have been due to PT trying to adjust IV line given it is on her L ankle. Given her gait speed and lateral sway, appears to be at high risk of falling.   Stairs            Wheelchair Mobility    Modified Rankin (Stroke Patients Only)       Balance Overall balance assessment: Needs assistance Sitting-balance support: No upper extremity supported Sitting balance-Leahy Scale: Good     Standing balance support: No upper extremity supported Standing balance-Leahy Scale: Fair                               Pertinent Vitals/Pain Pain Assessment: No/denies pain    Home Living Family/patient expects to be discharged to:: Private residence Living Arrangements: Alone Available Help at Discharge: Family;Available PRN/intermittently Type of Home: House         Home Equipment: None      Prior Function Level of Independence: Independent         Comments: Patient does not typically use any ADs and denies any falls.      Hand Dominance        Extremity/Trunk Assessment   Upper Extremity Assessment Upper Extremity Assessment: Overall WFL for tasks assessed    Lower Extremity Assessment Lower Extremity Assessment: Overall WFL for tasks assessed       Communication   Communication: No difficulties  Cognition Arousal/Alertness: Awake/alert  Behavior During Therapy: WFL for tasks assessed/performed Overall Cognitive Status: Within Functional Limits for tasks assessed                      General Comments      Exercises     Assessment/Plan    PT Assessment Patient needs continued PT services  PT Problem List Decreased strength;Decreased mobility;Decreased activity tolerance;Decreased balance;Cardiopulmonary status limiting activity;Decreased safety awareness          PT Treatment Interventions DME instruction;Therapeutic activities;Therapeutic exercise;Gait  training;Stair training;Balance training;Neuromuscular re-education    PT Goals (Current goals can be found in the Care Plan section)  Acute Rehab PT Goals Patient Stated Goal: TO get rid of her cough  PT Goal Formulation: With patient Time For Goal Achievement: 11/13/16 Potential to Achieve Goals: Good    Frequency Min 2X/week   Barriers to discharge Decreased caregiver support      Co-evaluation               End of Session Equipment Utilized During Treatment: Gait belt;Oxygen Activity Tolerance: Patient tolerated treatment well Patient left: in chair;with chair alarm set;with call bell/phone within reach;with family/visitor present Nurse Communication: Mobility status         Time: [id number] PT Time Calculation (min) (ACUTE ONLY): 17 min   Charges:   PT Evaluation $PT Eval Moderate Complexity: 1 Procedure     PT G Codes:       Alva GarnetPatrick Jahshua Bonito PT, DPT, CSCS    10/30/2016, 3:49 PM

## 2016-10-30 NOTE — Care Management Note (Addendum)
Case Management Note  Patient Details  Name: Gershon CraneGertrude Khalsa MRN: [id number] Date of Birth: 1934/10/02  Patient is admitted from home.  Need for 02 is acute. she receives medicaid pcs daily but does not know the name of the agency.   Patient does not have a preference for home health nursing/physical therapy.  Confirmed  her contact information.  Have reached out to local skilled home health agencies and patient is not on census.  Heads up referral to [facility] for [facility] PT and possible home 02.   No issues accessing medical care or paying for medications.  Physical therapy stated patient may benefit from pulmonary rehab  Expected Discharge Date:                  Expected Discharge Plan:  Home w Home Health Services  In-House Referral:     Discharge planning Services  CM Consult  Post Acute Care Choice:    Choice offered to:     DME Arranged:    DME Agency:     HH Arranged:    HH Agency:     Status of Service:  In process, will continue to follow  If discussed at Long Length of Stay Meetings, dates discussed:    Additional Comments:  Eber HongGreene, Roselina Burgueno R, RN 10/30/2016, 12:03 PM

## 2016-10-31 DIAGNOSIS — I130 Hypertensive heart and chronic kidney disease with heart failure and stage 1 through stage 4 chronic kidney disease, or unspecified chronic kidney disease: Secondary | ICD-10-CM | POA: Diagnosis not present

## 2016-10-31 DIAGNOSIS — J441 Chronic obstructive pulmonary disease with (acute) exacerbation: Secondary | ICD-10-CM | POA: Diagnosis not present

## 2016-10-31 DIAGNOSIS — J440 Chronic obstructive pulmonary disease with acute lower respiratory infection: Secondary | ICD-10-CM | POA: Diagnosis not present

## 2016-10-31 DIAGNOSIS — I5031 Acute diastolic (congestive) heart failure: Secondary | ICD-10-CM | POA: Diagnosis not present

## 2016-10-31 DIAGNOSIS — I10 Essential (primary) hypertension: Secondary | ICD-10-CM | POA: Diagnosis not present

## 2016-10-31 DIAGNOSIS — I472 Ventricular tachycardia: Secondary | ICD-10-CM | POA: Diagnosis not present

## 2016-10-31 DIAGNOSIS — J9601 Acute respiratory failure with hypoxia: Secondary | ICD-10-CM | POA: Diagnosis not present

## 2016-10-31 DIAGNOSIS — I5043 Acute on chronic combined systolic (congestive) and diastolic (congestive) heart failure: Secondary | ICD-10-CM | POA: Diagnosis not present

## 2016-10-31 DIAGNOSIS — N183 Chronic kidney disease, stage 3 (moderate): Secondary | ICD-10-CM | POA: Diagnosis not present

## 2016-10-31 DIAGNOSIS — R0602 Shortness of breath: Secondary | ICD-10-CM | POA: Diagnosis not present

## 2016-10-31 LAB — BASIC METABOLIC PANEL
Anion gap: 7 (ref 5–15)
BUN: 41 mg/dL — ABNORMAL HIGH (ref 6–20)
CALCIUM: 8.3 mg/dL — AB (ref 8.9–10.3)
CO2: 28 mmol/L (ref 22–32)
Chloride: 103 mmol/L (ref 101–111)
Creatinine, Ser: 1.24 mg/dL — ABNORMAL HIGH (ref 0.44–1.00)
GFR calc Af Amer: 47 mL/min — ABNORMAL LOW (ref >60)
GFR, EST NON AFRICAN AMERICAN: 40 mL/min — AB (ref >60)
GLUCOSE: 139 mg/dL — AB (ref 65–99)
Potassium: 4.6 mmol/L (ref 3.5–5.1)
Sodium: 138 mmol/L (ref 135–145)

## 2016-10-31 MED ORDER — PREGABALIN 50 MG PO CAPS
50.0000 mg | ORAL_CAPSULE | Freq: Every day | ORAL | Status: DC
  Administered 2016-10-31 – 2016-11-01 (×2): 50 mg via ORAL
  Filled 2016-10-31 (×2): qty 1

## 2016-10-31 MED ORDER — FUROSEMIDE 20 MG PO TABS
20.0000 mg | ORAL_TABLET | Freq: Two times a day (BID) | ORAL | Status: DC
  Administered 2016-10-31 – 2016-11-01 (×2): 20 mg via ORAL
  Filled 2016-10-31 (×2): qty 1

## 2016-10-31 MED ORDER — METHYLPREDNISOLONE SODIUM SUCC 125 MG IJ SOLR
60.0000 mg | Freq: Two times a day (BID) | INTRAMUSCULAR | Status: DC
  Administered 2016-10-31 – 2016-11-01 (×2): 60 mg via INTRAVENOUS
  Filled 2016-10-31 (×2): qty 2
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
## 2016-11-01 DIAGNOSIS — J440 Chronic obstructive pulmonary disease with acute lower respiratory infection: Secondary | ICD-10-CM | POA: Diagnosis not present

## 2016-11-01 DIAGNOSIS — Z720 Tobacco use: Secondary | ICD-10-CM | POA: Diagnosis not present

## 2016-11-01 DIAGNOSIS — R262 Difficulty in walking, not elsewhere classified: Secondary | ICD-10-CM

## 2016-11-01 DIAGNOSIS — I5031 Acute diastolic (congestive) heart failure: Secondary | ICD-10-CM | POA: Diagnosis not present

## 2016-11-01 DIAGNOSIS — J441 Chronic obstructive pulmonary disease with (acute) exacerbation: Secondary | ICD-10-CM | POA: Diagnosis not present

## 2016-11-01 DIAGNOSIS — I10 Essential (primary) hypertension: Secondary | ICD-10-CM | POA: Diagnosis not present

## 2016-11-01 DIAGNOSIS — I130 Hypertensive heart and chronic kidney disease with heart failure and stage 1 through stage 4 chronic kidney disease, or unspecified chronic kidney disease: Secondary | ICD-10-CM | POA: Diagnosis not present

## 2016-11-01 DIAGNOSIS — N183 Chronic kidney disease, stage 3 (moderate): Secondary | ICD-10-CM | POA: Diagnosis not present

## 2016-11-01 DIAGNOSIS — E782 Mixed hyperlipidemia: Secondary | ICD-10-CM | POA: Diagnosis not present

## 2016-11-01 DIAGNOSIS — I5043 Acute on chronic combined systolic (congestive) and diastolic (congestive) heart failure: Secondary | ICD-10-CM | POA: Diagnosis not present

## 2016-11-01 DIAGNOSIS — I472 Ventricular tachycardia: Secondary | ICD-10-CM | POA: Diagnosis not present

## 2016-11-01 DIAGNOSIS — J9601 Acute respiratory failure with hypoxia: Secondary | ICD-10-CM | POA: Diagnosis not present

## 2016-11-01 DIAGNOSIS — R0602 Shortness of breath: Secondary | ICD-10-CM | POA: Diagnosis not present

## 2016-11-01 LAB — BASIC METABOLIC PANEL
ANION GAP: 10 (ref 5–15)
BUN: 42 mg/dL — ABNORMAL HIGH (ref 6–20)
CALCIUM: 8.5 mg/dL — AB (ref 8.9–10.3)
CO2: 27 mmol/L (ref 22–32)
Chloride: 101 mmol/L (ref 101–111)
Creatinine, Ser: 1.15 mg/dL — ABNORMAL HIGH (ref 0.44–1.00)
GFR calc Af Amer: 51 mL/min — ABNORMAL LOW (ref >60)
GFR calc non Af Amer: 44 mL/min — ABNORMAL LOW (ref >60)
GLUCOSE: 169 mg/dL — AB (ref 65–99)
POTASSIUM: 4.6 mmol/L (ref 3.5–5.1)
Sodium: 138 mmol/L (ref 135–145)

## 2016-11-01 MED ORDER — AZITHROMYCIN 500 MG PO TABS: 500.0000 mg | ORAL_TABLET | Freq: Every day | ORAL | 0 refills | Status: AC

## 2016-11-01 MED ORDER — PREDNISONE 10 MG (21) PO TBPK: 10.0000 mg | ORAL_TABLET | Freq: Every day | ORAL | 0 refills | Status: DC

## 2016-11-01 MED ORDER — IPRATROPIUM-ALBUTEROL 0.5-2.5 (3) MG/3ML IN SOLN: 3.0000 mL | Freq: Four times a day (QID) | RESPIRATORY_TRACT | 0 refills | Status: DC | PRN

## 2016-11-01 MED ORDER — BUDESONIDE 0.5 MG/2ML IN SUSP: 0.5000 mg | Freq: Two times a day (BID) | RESPIRATORY_TRACT | 12 refills | Status: DC

## 2016-11-01 MED ORDER — OXYCODONE-ACETAMINOPHEN 5-325 MG PO TABS
1.0000 | ORAL_TABLET | ORAL | Status: DC | PRN
  Administered 2016-11-01 (×2): 1 via ORAL
  Filled 2016-11-01 (×2): qty 1

## 2016-11-01 MED ORDER — OXYCODONE-ACETAMINOPHEN 5-325 MG PO TABS: 1.0000 | ORAL_TABLET | ORAL | 0 refills | Status: DC | PRN

## 2016-11-01 NOTE — Plan of Care (Signed)
Problem: Safety: Goal: Ability to remain free from injury will improve Outcome: Progressing Pt continues to remain free from falls/injury. Bed alarm activated. Encouraged to call for help when needed to get up.  Problem: Pain Managment: Goal: General experience of comfort will improve Outcome: Completed/Met Date Met: 11/01/16 Pt with no complaints of pain this shift.

## 2016-11-01 NOTE — Progress Notes (Signed)
Pt with 6 beat run of vtach, MD is aware. Will continue to monitor. Shirley FriarAlexis Miller, RN, BSN

## 2016-11-01 NOTE — Discharge Summary (Signed)
[facility] - [place] at [facility], 82 y.o., DOB [birth year], MRN [id number]. Admission date: 10/29/2016 Discharge Date 11/01/2016 Primary MD Baruch Gouty, MD Admitting Physician Alford Highland, MD  Admission Diagnosis  Acute pulmonary edema (HCC) [J81.0] SOB (shortness of breath) [R06.02] Hypoxia [R09.02]  Discharge Diagnosis   Principal Problem:   Acute respiratory failure with hypoxia (HCC) Acute on chronic COPD exasperation   Acute on chronic diastolic CHF Essential hypertension   Murmur   Hyperlipidemia   Tobacco abuse   COPD exacerbation (HCC)   Acute on chronic combined systolic and diastolic CHF (congestive heart failure) (HCC)   Hypoxia   Pulmonary hypertension  Chronic kidney disease stage III        Hospital CourseGertrude Moore  is a 82 y.o. female with a known history of Hypertension and COPD presents with shortness of breath. She states that her breathing is worse when she smoking cigarettes. She's had a rattling in her chest for about a month. She went to her appointment with her primary care physician and was sent to the ER for low oxygen saturations. Patient was admitted for acute on chronic diastolic CHF as well as acute on chronic COPD exasperation acute bronchitis. Patient was treated with nebulizer. She had a echo which showed pulmonary hypertension. Likely due to underlying lung disease. Patient is being discharged home with home oxygen therapy. Her breathing is improved. She also complained of pain related to her previous shingles which is being treated.             Consults  cardiology  Significant Tests:  See full reports for all details     Dg Chest Portable 1 View  Result Date: 10/29/2016 CLINICAL DATA:  Atrial fibrillation. EXAM: PORTABLE CHEST 1 VIEW COMPARISON:  07/30/2016 . FINDINGS: Mediastinum and hilar structures stable. Cardiomegaly with pulmonary venous congestion bilateral interstitial prominence,  right side greater than left. Small bilateral pleural effusions. These findings are consistent with congestive heart failure . No pneumothorax. IMPRESSION: Congestive heart failure pulmonary interstitial edema and bilateral small pleural effusions. Electronically Signed   By: Maisie Fus  Register   On: 10/29/2016 11:25       Today   Subjective:   Helen Moore  Feeling better shortness of breath improved Objective:   Blood pressure 130/60, pulse 72, temperature 98.1 F (36.7 C), temperature source Oral, resp. rate 20, height 5\' 6"  (1.676 m), weight 150 lb (68 kg), SpO2 94 %.  .  Intake/Output Summary (Last 24 hours) at 11/01/16 1611 Last data filed at 11/01/16 1345  Gross per 24 hour  Intake              360 ml  Output              575 ml  Net             -215 ml    Exam VITAL SIGNS: Blood pressure 130/60, pulse 72, temperature 98.1 F (36.7 C), temperature source Oral, resp. rate 20, height 5\' 6"  (1.676 m), weight 150 lb (68 kg), SpO2 94 %.  GENERAL:  82 y.o.-year-old patient lying in the bed with no acute distress.  EYES: Pupils equal, round, reactive to light and accommodation. No scleral icterus. Extraocular muscles intact.  HEENT: Head atraumatic, normocephalic. Oropharynx and nasopharynx clear.  NECK:  Supple, no jugular venous distention. No thyroid enlargement, no tenderness.  LUNGS: Normal breath sounds bilaterally, no wheezing, rales,rhonchi or crepitation. No use of accessory muscles of respiration.  CARDIOVASCULAR:  S1, S2 normal. No murmurs, rubs, or gallops.  ABDOMEN: Soft, nontender, nondistended. Bowel sounds present. No organomegaly or mass.  EXTREMITIES: No pedal edema, cyanosis, or clubbing.  NEUROLOGIC: Cranial nerves II through XII are intact. Muscle strength 5/5 in all extremities. Sensation intact. Gait not checked.  PSYCHIATRIC: The patient is alert and oriented x 3.  SKIN: No obvious rash, lesion, or ulcer.   Data Review     CBC w Diff: Lab Results   Component Value Date   WBC 8.2 10/30/2016   HGB 12.5 10/30/2016   HGB 12.2 11/03/2012   HCT 36.5 10/30/2016   HCT 39.1 11/03/2012   PLT 188 10/30/2016   PLT 234 11/03/2012   LYMPHOPCT 22 07/12/2016   LYMPHOPCT 27.6 11/03/2012   MONOPCT 8 07/12/2016   MONOPCT 11.3 11/03/2012   EOSPCT 1 07/12/2016   EOSPCT 1.1 11/03/2012   BASOPCT 0 07/12/2016   BASOPCT 1.1 11/03/2012   CMP: Lab Results  Component Value Date   NA 138 11/01/2016   NA 140 05/17/2014   K 4.6 11/01/2016   K 4.2 05/17/2014   CL 101 11/01/2016   CL 106 05/17/2014   CO2 27 11/01/2016   CO2 23 05/17/2014   BUN 42 (H) 11/01/2016   BUN 20 (H) 05/17/2014   CREATININE 1.15 (H) 11/01/2016   CREATININE 1.29 (H) 07/12/2016   PROT 7.6 07/12/2016   PROT 7.9 05/17/2014   ALBUMIN 3.8 07/12/2016   ALBUMIN 3.6 05/17/2014   BILITOT 0.3 07/12/2016   BILITOT 0.2 05/17/2014   ALKPHOS 44 07/12/2016   ALKPHOS 68 05/17/2014   AST 16 07/12/2016   AST 24 05/17/2014   ALT 9 07/12/2016   ALT 22 05/17/2014  .  Micro Results No results found for this or any previous visit (from the past 240 hour(s)).      Code Status Orders        Start     Ordered   10/29/16 1432  Full code  Continuous     10/29/16 1431    Code Status History    Date Active Date Inactive Code Status Order ID Comments User Context   This patient has a current code status but no historical code status.          Follow-up Information    Delma Freeze, FNP. Go on 11/06/2016.   Specialty:  Family Medicine Why:  at 11:30am to the [facility] Clinic Contact information: [address] [place] [address] [phone]           Discharge Medications   Allergies as of 11/01/2016      Reactions   Motrin [ibuprofen]    Sulfur       Medication List    TAKE these medications   amLODipine 10 MG tablet Commonly known as:  NORVASC TAKE 1 TABLET (10 MG TOTAL) BY MOUTH DAILY.   aspirin 81 MG tablet Take 81 mg by  mouth daily.   azithromycin 500 MG tablet Commonly known as:  ZITHROMAX Take 1 tablet (500 mg total) by mouth daily.   budesonide 0.5 MG/2ML nebulizer solution Commonly known as:  PULMICORT Take 2 mLs (0.5 mg total) by nebulization 2 (two) times daily.   furosemide 20 MG tablet Commonly known as:  LASIX Take 1 tablet (20 mg total) by mouth daily.   ipratropium-albuterol 0.5-2.5 (3) MG/3ML Soln Commonly known as:  DUONEB Take 3 mLs by nebulization every 6 (six) hours as needed.   metoprolol succinate 50  MG 24 hr tablet Commonly known as:  TOPROL-XL Take 1 tablet (50 mg total) by mouth daily.   oxyCODONE-acetaminophen 5-325 MG tablet Commonly known as:  PERCOCET/ROXICET Take 1 tablet by mouth every 4 (four) hours as needed for moderate pain.   potassium chloride 10 MEQ tablet Commonly known as:  K-DUR Take 1 tablet (10 mEq total) by mouth daily.   predniSONE 10 MG (21) Tbpk tablet Commonly known as:  STERAPRED UNI-PAK 21 TAB Take 1 tablet (10 mg total) by mouth daily. Start 60mg  taper by 10mg  until complete   tiotropium 18 MCG inhalation capsule Commonly known as:  SPIRIVA HANDIHALER Place 1 capsule (18 mcg total) into inhaler and inhale daily.            Durable Medical Equipment        Start     Ordered   11/01/16 [phone]  For home use only DME oxygen  Once    Question Answer Comment  Mode or (Route) Nasal cannula   Liters per Minute 1   Frequency Continuous (stationary and portable oxygen unit needed)   Oxygen delivery system Gas      11/01/16 0936   11/01/16 0937  For home use only DME Nebulizer machine  Once    Question:  Patient needs a nebulizer to treat with the following condition  Answer:  COPD (chronic obstructive pulmonary disease) (HCC)   11/01/16 [id number]         Total Time in preparing paper work, data evaluation and todays exam - 35 minutes  Auburn BilberryPATEL, Michaelyn Wall M.D on 11/01/2016 at 4:11 PM  [facility]   Office  [phone]

## 2016-11-01 NOTE — Care Management Note (Signed)
Case Management Note  Patient Details  Name: Helen CraneGertrude Moore MRN: [id number] Date of Birth: 1935-02-19  Discharge home today with new home 02, home nebulizer and SN PT through [facility]  Confirmed contact information  Expected Discharge Date:                  Expected Discharge Plan:  Home w Home Health Services  In-House Referral:     Discharge planning Services  CM Consult  Post Acute Care Choice:  NA, Durable Medical Equipment, Home Health Choice offered to:  Patient  DME Arranged:  Oxygen, Nebulizer machine DME Agency:  [facility] (If needs 02)  HH Arranged:  RN, PT HH Agency:  [facility]  Status of Service:  Completed, signed off  If discussed at Long Length of Stay Meetings, dates discussed:    Additional Comments:  Eber HongGreene, Mariabella Nilsen R, RN 11/01/2016, 10:09 AM

## 2016-11-01 NOTE — Plan of Care (Signed)
Problem: Activity: Goal: Capacity to carry out activities will improve Outcome: Progressing Pt ambulated on room air to assess oxygen needs.  Problem: Cardiac: Goal: Ability to achieve and maintain adequate cardiopulmonary perfusion will improve Outcome: Progressing Pt weaned to 1L per [place].  Problem: Education: Goal: Ability to verbalize understanding of medication therapies will improve Outcome: Progressing Discussed patients home medication.

## 2016-11-01 NOTE — Progress Notes (Signed)
SATURATION QUALIFICATIONS: (This note is used to comply with regulatory documentation for home oxygen) ? ?Patient Saturations on Room Air at Rest = 90% ? ?Patient Saturations on Room Air while Ambulating = 87% ? ?Patient Saturations on 1 Liters of oxygen while Ambulating = 92% ? ?Please briefly explain why patient needs home oxygen: ?

## 2016-11-01 NOTE — Progress Notes (Signed)
MD notified. Pt complaints of pain 8/10 in back. Orders received. I will continue to assess.

## 2016-11-01 NOTE — Care Management Important Message (Signed)
Important Message  Patient Details  Name: Helen CraneGertrude Moore MRN: [id number] Date of Birth: 1934-10-24   Medicare Important Message Given:  Yes    Eber HongGreene, Jhana Giarratano R, RN 11/01/2016, 9:18 AM

## 2016-11-05 DIAGNOSIS — I130 Hypertensive heart and chronic kidney disease with heart failure and stage 1 through stage 4 chronic kidney disease, or unspecified chronic kidney disease: Secondary | ICD-10-CM | POA: Diagnosis not present

## 2016-11-05 DIAGNOSIS — Z7982 Long term (current) use of aspirin: Secondary | ICD-10-CM | POA: Diagnosis not present

## 2016-11-05 DIAGNOSIS — E785 Hyperlipidemia, unspecified: Secondary | ICD-10-CM | POA: Diagnosis not present

## 2016-11-05 DIAGNOSIS — N183 Chronic kidney disease, stage 3 (moderate): Secondary | ICD-10-CM | POA: Diagnosis not present

## 2016-11-05 DIAGNOSIS — I5043 Acute on chronic combined systolic (congestive) and diastolic (congestive) heart failure: Secondary | ICD-10-CM | POA: Diagnosis not present

## 2016-11-05 DIAGNOSIS — J810 Acute pulmonary edema: Secondary | ICD-10-CM | POA: Diagnosis not present

## 2016-11-05 DIAGNOSIS — I2510 Atherosclerotic heart disease of native coronary artery without angina pectoris: Secondary | ICD-10-CM | POA: Diagnosis not present

## 2016-11-05 DIAGNOSIS — I272 Pulmonary hypertension, unspecified: Secondary | ICD-10-CM | POA: Diagnosis not present

## 2016-11-05 DIAGNOSIS — J9601 Acute respiratory failure with hypoxia: Secondary | ICD-10-CM | POA: Diagnosis not present

## 2016-11-05 DIAGNOSIS — Z9981 Dependence on supplemental oxygen: Secondary | ICD-10-CM | POA: Diagnosis not present

## 2016-11-05 DIAGNOSIS — M1712 Unilateral primary osteoarthritis, left knee: Secondary | ICD-10-CM | POA: Diagnosis not present

## 2016-11-05 DIAGNOSIS — J449 Chronic obstructive pulmonary disease, unspecified: Secondary | ICD-10-CM | POA: Diagnosis not present

## 2016-11-05 DIAGNOSIS — J441 Chronic obstructive pulmonary disease with (acute) exacerbation: Secondary | ICD-10-CM | POA: Diagnosis not present

## 2016-11-06 ENCOUNTER — Encounter: Payer: Self-pay | Admitting: Family

## 2016-11-06 ENCOUNTER — Ambulatory Visit: Payer: Medicare Other | Attending: Family | Admitting: Family

## 2016-11-06 VITALS — BP 131/78 | HR 107 | Resp 18 | Ht 66.0 in | Wt 143.5 lb

## 2016-11-06 DIAGNOSIS — R000 Tachycardia, unspecified: Secondary | ICD-10-CM | POA: Insufficient documentation

## 2016-11-06 DIAGNOSIS — Z87891 Personal history of nicotine dependence: Secondary | ICD-10-CM | POA: Diagnosis not present

## 2016-11-06 DIAGNOSIS — I272 Pulmonary hypertension, unspecified: Secondary | ICD-10-CM | POA: Diagnosis not present

## 2016-11-06 DIAGNOSIS — I5032 Chronic diastolic (congestive) heart failure: Secondary | ICD-10-CM | POA: Insufficient documentation

## 2016-11-06 DIAGNOSIS — Z7982 Long term (current) use of aspirin: Secondary | ICD-10-CM | POA: Diagnosis not present

## 2016-11-06 DIAGNOSIS — Z79899 Other long term (current) drug therapy: Secondary | ICD-10-CM | POA: Insufficient documentation

## 2016-11-06 DIAGNOSIS — Z720 Tobacco use: Secondary | ICD-10-CM

## 2016-11-06 DIAGNOSIS — E785 Hyperlipidemia, unspecified: Secondary | ICD-10-CM | POA: Insufficient documentation

## 2016-11-06 DIAGNOSIS — I110 Hypertensive heart disease with heart failure: Secondary | ICD-10-CM | POA: Diagnosis not present

## 2016-11-06 DIAGNOSIS — I10 Essential (primary) hypertension: Secondary | ICD-10-CM

## 2016-11-06 DIAGNOSIS — J449 Chronic obstructive pulmonary disease, unspecified: Secondary | ICD-10-CM | POA: Insufficient documentation

## 2016-11-06 MED ORDER — POTASSIUM CHLORIDE ER 10 MEQ PO TBCR: 10.0000 meq | EXTENDED_RELEASE_TABLET | Freq: Every day | ORAL | 5 refills | Status: AC

## 2016-11-06 MED ORDER — FUROSEMIDE 20 MG PO TABS: 20.0000 mg | ORAL_TABLET | Freq: Every day | ORAL | 5 refills | Status: AC

## 2016-11-06 MED ORDER — ASPIRIN 81 MG PO TABS: 81.0000 mg | ORAL_TABLET | Freq: Every day | ORAL | 5 refills | Status: AC

## 2016-11-06 MED ORDER — METOPROLOL SUCCINATE ER 50 MG PO TB24: 50.0000 mg | ORAL_TABLET | Freq: Every day | ORAL | 5 refills | Status: DC

## 2016-11-06 NOTE — Progress Notes (Signed)
   10/30/16 1334  PT Time Calculation  PT Start Time (ACUTE ONLY) 1105  PT Stop Time (ACUTE ONLY) 1122  PT Time Calculation (min) (ACUTE ONLY) 17 min  PT G-Codes **NOT FOR INPATIENT CLASS**  Functional Assessment Tool Used Clinical judgement  Functional Limitation Mobility: Walking and moving around  Mobility: Walking and Moving Around Current Status (A5409(G8978) CJ  Mobility: Walking and Moving Around Goal Status (W1191(G8979) CI  PT General Charges  $$ ACUTE PT VISIT 1 Procedure  PT Evaluation  $PT Eval Moderate Complexity 1 Procedure   Late-entry g-codes entered after review of initial documentation/evaluation by Francis DowsePat McNamara, PT.  Tommy RainwaterKristen H. Manson PasseyBrown, PT, DPT, NCS 11/06/16, 12:05 PM [phone]

## 2016-11-06 NOTE — Patient Instructions (Signed)
Begin weighing daily and call for an overnight weight gain of > 2 pounds or a weekly weight gain of >5 pounds. 
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
## 2016-11-07 DIAGNOSIS — J441 Chronic obstructive pulmonary disease with (acute) exacerbation: Secondary | ICD-10-CM | POA: Diagnosis not present

## 2016-11-07 DIAGNOSIS — I130 Hypertensive heart and chronic kidney disease with heart failure and stage 1 through stage 4 chronic kidney disease, or unspecified chronic kidney disease: Secondary | ICD-10-CM | POA: Diagnosis not present

## 2016-11-07 DIAGNOSIS — J449 Chronic obstructive pulmonary disease, unspecified: Secondary | ICD-10-CM | POA: Diagnosis not present

## 2016-11-07 DIAGNOSIS — J9601 Acute respiratory failure with hypoxia: Secondary | ICD-10-CM | POA: Diagnosis not present

## 2016-11-07 DIAGNOSIS — I5043 Acute on chronic combined systolic (congestive) and diastolic (congestive) heart failure: Secondary | ICD-10-CM | POA: Diagnosis not present

## 2016-11-07 DIAGNOSIS — J810 Acute pulmonary edema: Secondary | ICD-10-CM | POA: Diagnosis not present

## 2016-11-07 MED ORDER — IPRATROPIUM-ALBUTEROL 0.5-2.5 (3) MG/3ML IN SOLN: 3.0000 mL | Freq: Four times a day (QID) | RESPIRATORY_TRACT | 5 refills | Status: AC | PRN

## 2016-11-07 MED ORDER — BUDESONIDE 0.5 MG/2ML IN SUSP: 0.5000 mg | Freq: Two times a day (BID) | RESPIRATORY_TRACT | 5 refills | Status: AC

## 2016-11-08 ENCOUNTER — Encounter: Payer: Self-pay | Admitting: Family

## 2016-11-08 DIAGNOSIS — I130 Hypertensive heart and chronic kidney disease with heart failure and stage 1 through stage 4 chronic kidney disease, or unspecified chronic kidney disease: Secondary | ICD-10-CM | POA: Diagnosis not present

## 2016-11-08 DIAGNOSIS — I5032 Chronic diastolic (congestive) heart failure: Secondary | ICD-10-CM | POA: Insufficient documentation

## 2016-11-08 DIAGNOSIS — I5043 Acute on chronic combined systolic (congestive) and diastolic (congestive) heart failure: Secondary | ICD-10-CM | POA: Diagnosis not present

## 2016-11-08 DIAGNOSIS — J449 Chronic obstructive pulmonary disease, unspecified: Secondary | ICD-10-CM | POA: Diagnosis not present

## 2016-11-08 DIAGNOSIS — J810 Acute pulmonary edema: Secondary | ICD-10-CM | POA: Diagnosis not present

## 2016-11-08 DIAGNOSIS — J441 Chronic obstructive pulmonary disease with (acute) exacerbation: Secondary | ICD-10-CM | POA: Diagnosis not present

## 2016-11-08 DIAGNOSIS — J9601 Acute respiratory failure with hypoxia: Secondary | ICD-10-CM | POA: Diagnosis not present

## 2016-11-11 DIAGNOSIS — I130 Hypertensive heart and chronic kidney disease with heart failure and stage 1 through stage 4 chronic kidney disease, or unspecified chronic kidney disease: Secondary | ICD-10-CM | POA: Diagnosis not present

## 2016-11-11 DIAGNOSIS — J449 Chronic obstructive pulmonary disease, unspecified: Secondary | ICD-10-CM | POA: Diagnosis not present

## 2016-11-11 DIAGNOSIS — J9601 Acute respiratory failure with hypoxia: Secondary | ICD-10-CM | POA: Diagnosis not present

## 2016-11-11 DIAGNOSIS — J441 Chronic obstructive pulmonary disease with (acute) exacerbation: Secondary | ICD-10-CM | POA: Diagnosis not present

## 2016-11-11 DIAGNOSIS — J810 Acute pulmonary edema: Secondary | ICD-10-CM | POA: Diagnosis not present

## 2016-11-11 DIAGNOSIS — I5043 Acute on chronic combined systolic (congestive) and diastolic (congestive) heart failure: Secondary | ICD-10-CM | POA: Diagnosis not present

## 2016-11-13 DIAGNOSIS — J810 Acute pulmonary edema: Secondary | ICD-10-CM | POA: Diagnosis not present

## 2016-11-13 DIAGNOSIS — I130 Hypertensive heart and chronic kidney disease with heart failure and stage 1 through stage 4 chronic kidney disease, or unspecified chronic kidney disease: Secondary | ICD-10-CM | POA: Diagnosis not present

## 2016-11-13 DIAGNOSIS — J449 Chronic obstructive pulmonary disease, unspecified: Secondary | ICD-10-CM | POA: Diagnosis not present

## 2016-11-13 DIAGNOSIS — J441 Chronic obstructive pulmonary disease with (acute) exacerbation: Secondary | ICD-10-CM | POA: Diagnosis not present

## 2016-11-13 DIAGNOSIS — I5043 Acute on chronic combined systolic (congestive) and diastolic (congestive) heart failure: Secondary | ICD-10-CM | POA: Diagnosis not present

## 2016-11-13 DIAGNOSIS — J9601 Acute respiratory failure with hypoxia: Secondary | ICD-10-CM | POA: Diagnosis not present

## 2016-11-15 DIAGNOSIS — J9601 Acute respiratory failure with hypoxia: Secondary | ICD-10-CM | POA: Diagnosis not present

## 2016-11-15 DIAGNOSIS — J810 Acute pulmonary edema: Secondary | ICD-10-CM | POA: Diagnosis not present

## 2016-11-15 DIAGNOSIS — I130 Hypertensive heart and chronic kidney disease with heart failure and stage 1 through stage 4 chronic kidney disease, or unspecified chronic kidney disease: Secondary | ICD-10-CM | POA: Diagnosis not present

## 2016-11-15 DIAGNOSIS — I5043 Acute on chronic combined systolic (congestive) and diastolic (congestive) heart failure: Secondary | ICD-10-CM | POA: Diagnosis not present

## 2016-11-15 DIAGNOSIS — J449 Chronic obstructive pulmonary disease, unspecified: Secondary | ICD-10-CM | POA: Diagnosis not present

## 2016-11-15 DIAGNOSIS — J441 Chronic obstructive pulmonary disease with (acute) exacerbation: Secondary | ICD-10-CM | POA: Diagnosis not present

## 2016-11-19 ENCOUNTER — Ambulatory Visit: Payer: Medicare Other | Admitting: Cardiovascular Disease

## 2016-11-19 ENCOUNTER — Encounter: Payer: Self-pay | Admitting: *Deleted

## 2016-11-19 DIAGNOSIS — I130 Hypertensive heart and chronic kidney disease with heart failure and stage 1 through stage 4 chronic kidney disease, or unspecified chronic kidney disease: Secondary | ICD-10-CM | POA: Diagnosis not present

## 2016-11-19 DIAGNOSIS — J449 Chronic obstructive pulmonary disease, unspecified: Secondary | ICD-10-CM | POA: Diagnosis not present

## 2016-11-19 DIAGNOSIS — I5043 Acute on chronic combined systolic (congestive) and diastolic (congestive) heart failure: Secondary | ICD-10-CM | POA: Diagnosis not present

## 2016-11-19 DIAGNOSIS — J441 Chronic obstructive pulmonary disease with (acute) exacerbation: Secondary | ICD-10-CM | POA: Diagnosis not present

## 2016-11-19 DIAGNOSIS — J810 Acute pulmonary edema: Secondary | ICD-10-CM | POA: Diagnosis not present

## 2016-11-19 DIAGNOSIS — J9601 Acute respiratory failure with hypoxia: Secondary | ICD-10-CM | POA: Diagnosis not present

## 2016-11-21 DIAGNOSIS — J810 Acute pulmonary edema: Secondary | ICD-10-CM | POA: Diagnosis not present

## 2016-11-21 DIAGNOSIS — J449 Chronic obstructive pulmonary disease, unspecified: Secondary | ICD-10-CM | POA: Diagnosis not present

## 2016-11-21 DIAGNOSIS — J9601 Acute respiratory failure with hypoxia: Secondary | ICD-10-CM | POA: Diagnosis not present

## 2016-11-21 DIAGNOSIS — J441 Chronic obstructive pulmonary disease with (acute) exacerbation: Secondary | ICD-10-CM | POA: Diagnosis not present

## 2016-11-21 DIAGNOSIS — I130 Hypertensive heart and chronic kidney disease with heart failure and stage 1 through stage 4 chronic kidney disease, or unspecified chronic kidney disease: Secondary | ICD-10-CM | POA: Diagnosis not present

## 2016-11-21 DIAGNOSIS — I5043 Acute on chronic combined systolic (congestive) and diastolic (congestive) heart failure: Secondary | ICD-10-CM | POA: Diagnosis not present

## 2016-11-26 DIAGNOSIS — I5043 Acute on chronic combined systolic (congestive) and diastolic (congestive) heart failure: Secondary | ICD-10-CM | POA: Diagnosis not present

## 2016-11-26 DIAGNOSIS — J449 Chronic obstructive pulmonary disease, unspecified: Secondary | ICD-10-CM | POA: Diagnosis not present

## 2016-11-26 DIAGNOSIS — J9601 Acute respiratory failure with hypoxia: Secondary | ICD-10-CM | POA: Diagnosis not present

## 2016-11-26 DIAGNOSIS — J441 Chronic obstructive pulmonary disease with (acute) exacerbation: Secondary | ICD-10-CM | POA: Diagnosis not present

## 2016-11-26 DIAGNOSIS — I130 Hypertensive heart and chronic kidney disease with heart failure and stage 1 through stage 4 chronic kidney disease, or unspecified chronic kidney disease: Secondary | ICD-10-CM | POA: Diagnosis not present

## 2016-11-26 DIAGNOSIS — J810 Acute pulmonary edema: Secondary | ICD-10-CM | POA: Diagnosis not present

## 2016-11-27 DIAGNOSIS — J810 Acute pulmonary edema: Secondary | ICD-10-CM | POA: Diagnosis not present

## 2016-11-27 DIAGNOSIS — J9601 Acute respiratory failure with hypoxia: Secondary | ICD-10-CM | POA: Diagnosis not present

## 2016-11-27 DIAGNOSIS — I130 Hypertensive heart and chronic kidney disease with heart failure and stage 1 through stage 4 chronic kidney disease, or unspecified chronic kidney disease: Secondary | ICD-10-CM | POA: Diagnosis not present

## 2016-11-27 DIAGNOSIS — I5043 Acute on chronic combined systolic (congestive) and diastolic (congestive) heart failure: Secondary | ICD-10-CM | POA: Diagnosis not present

## 2016-11-27 DIAGNOSIS — J449 Chronic obstructive pulmonary disease, unspecified: Secondary | ICD-10-CM | POA: Diagnosis not present

## 2016-11-27 DIAGNOSIS — J441 Chronic obstructive pulmonary disease with (acute) exacerbation: Secondary | ICD-10-CM | POA: Diagnosis not present

## 2016-11-28 DIAGNOSIS — J810 Acute pulmonary edema: Secondary | ICD-10-CM | POA: Diagnosis not present

## 2016-11-28 DIAGNOSIS — J449 Chronic obstructive pulmonary disease, unspecified: Secondary | ICD-10-CM | POA: Diagnosis not present

## 2016-11-28 DIAGNOSIS — I130 Hypertensive heart and chronic kidney disease with heart failure and stage 1 through stage 4 chronic kidney disease, or unspecified chronic kidney disease: Secondary | ICD-10-CM | POA: Diagnosis not present

## 2016-11-28 DIAGNOSIS — J9601 Acute respiratory failure with hypoxia: Secondary | ICD-10-CM | POA: Diagnosis not present

## 2016-11-28 DIAGNOSIS — J441 Chronic obstructive pulmonary disease with (acute) exacerbation: Secondary | ICD-10-CM | POA: Diagnosis not present

## 2016-11-28 DIAGNOSIS — I5043 Acute on chronic combined systolic (congestive) and diastolic (congestive) heart failure: Secondary | ICD-10-CM | POA: Diagnosis not present

## 2016-12-03 ENCOUNTER — Ambulatory Visit: Payer: Medicare Other | Admitting: Family

## 2016-12-03 ENCOUNTER — Telehealth: Payer: Self-pay | Admitting: Family

## 2016-12-03 NOTE — Telephone Encounter (Signed)
Patient did not show for her [facility] appointment on 12/03/16. Will attempt to reschedule.  

## 2016-12-04 DIAGNOSIS — J449 Chronic obstructive pulmonary disease, unspecified: Secondary | ICD-10-CM | POA: Diagnosis not present

## 2016-12-04 DIAGNOSIS — J810 Acute pulmonary edema: Secondary | ICD-10-CM | POA: Diagnosis not present

## 2016-12-04 DIAGNOSIS — J441 Chronic obstructive pulmonary disease with (acute) exacerbation: Secondary | ICD-10-CM | POA: Diagnosis not present

## 2016-12-04 DIAGNOSIS — I130 Hypertensive heart and chronic kidney disease with heart failure and stage 1 through stage 4 chronic kidney disease, or unspecified chronic kidney disease: Secondary | ICD-10-CM | POA: Diagnosis not present

## 2016-12-04 DIAGNOSIS — I5043 Acute on chronic combined systolic (congestive) and diastolic (congestive) heart failure: Secondary | ICD-10-CM | POA: Diagnosis not present

## 2016-12-04 DIAGNOSIS — J9601 Acute respiratory failure with hypoxia: Secondary | ICD-10-CM | POA: Diagnosis not present

## 2016-12-05 DIAGNOSIS — J449 Chronic obstructive pulmonary disease, unspecified: Secondary | ICD-10-CM | POA: Diagnosis not present

## 2016-12-05 DIAGNOSIS — J810 Acute pulmonary edema: Secondary | ICD-10-CM | POA: Diagnosis not present

## 2016-12-05 DIAGNOSIS — I130 Hypertensive heart and chronic kidney disease with heart failure and stage 1 through stage 4 chronic kidney disease, or unspecified chronic kidney disease: Secondary | ICD-10-CM | POA: Diagnosis not present

## 2016-12-05 DIAGNOSIS — J441 Chronic obstructive pulmonary disease with (acute) exacerbation: Secondary | ICD-10-CM | POA: Diagnosis not present

## 2016-12-05 DIAGNOSIS — I5043 Acute on chronic combined systolic (congestive) and diastolic (congestive) heart failure: Secondary | ICD-10-CM | POA: Diagnosis not present

## 2016-12-05 DIAGNOSIS — J9601 Acute respiratory failure with hypoxia: Secondary | ICD-10-CM | POA: Diagnosis not present

## 2016-12-17 DIAGNOSIS — I5043 Acute on chronic combined systolic (congestive) and diastolic (congestive) heart failure: Secondary | ICD-10-CM | POA: Diagnosis not present

## 2016-12-17 DIAGNOSIS — J449 Chronic obstructive pulmonary disease, unspecified: Secondary | ICD-10-CM | POA: Diagnosis not present

## 2016-12-17 DIAGNOSIS — I130 Hypertensive heart and chronic kidney disease with heart failure and stage 1 through stage 4 chronic kidney disease, or unspecified chronic kidney disease: Secondary | ICD-10-CM | POA: Diagnosis not present

## 2016-12-17 DIAGNOSIS — J810 Acute pulmonary edema: Secondary | ICD-10-CM | POA: Diagnosis not present

## 2016-12-17 DIAGNOSIS — J441 Chronic obstructive pulmonary disease with (acute) exacerbation: Secondary | ICD-10-CM | POA: Diagnosis not present

## 2016-12-17 DIAGNOSIS — J9601 Acute respiratory failure with hypoxia: Secondary | ICD-10-CM | POA: Diagnosis not present

## 2016-12-26 DIAGNOSIS — I5043 Acute on chronic combined systolic (congestive) and diastolic (congestive) heart failure: Secondary | ICD-10-CM | POA: Diagnosis not present

## 2016-12-26 DIAGNOSIS — J449 Chronic obstructive pulmonary disease, unspecified: Secondary | ICD-10-CM | POA: Diagnosis not present

## 2016-12-26 DIAGNOSIS — J441 Chronic obstructive pulmonary disease with (acute) exacerbation: Secondary | ICD-10-CM | POA: Diagnosis not present

## 2016-12-26 DIAGNOSIS — J810 Acute pulmonary edema: Secondary | ICD-10-CM | POA: Diagnosis not present

## 2016-12-26 DIAGNOSIS — J9601 Acute respiratory failure with hypoxia: Secondary | ICD-10-CM | POA: Diagnosis not present

## 2016-12-26 DIAGNOSIS — I130 Hypertensive heart and chronic kidney disease with heart failure and stage 1 through stage 4 chronic kidney disease, or unspecified chronic kidney disease: Secondary | ICD-10-CM | POA: Diagnosis not present

## 2016-12-30 DIAGNOSIS — I130 Hypertensive heart and chronic kidney disease with heart failure and stage 1 through stage 4 chronic kidney disease, or unspecified chronic kidney disease: Secondary | ICD-10-CM | POA: Diagnosis not present

## 2016-12-30 DIAGNOSIS — J810 Acute pulmonary edema: Secondary | ICD-10-CM | POA: Diagnosis not present

## 2016-12-30 DIAGNOSIS — I5043 Acute on chronic combined systolic (congestive) and diastolic (congestive) heart failure: Secondary | ICD-10-CM | POA: Diagnosis not present

## 2016-12-30 DIAGNOSIS — J9601 Acute respiratory failure with hypoxia: Secondary | ICD-10-CM | POA: Diagnosis not present

## 2016-12-30 DIAGNOSIS — J441 Chronic obstructive pulmonary disease with (acute) exacerbation: Secondary | ICD-10-CM | POA: Diagnosis not present

## 2016-12-30 DIAGNOSIS — J449 Chronic obstructive pulmonary disease, unspecified: Secondary | ICD-10-CM | POA: Diagnosis not present

## 2017-03-05 DIAGNOSIS — F17200 Nicotine dependence, unspecified, uncomplicated: Secondary | ICD-10-CM | POA: Diagnosis not present

## 2017-03-05 DIAGNOSIS — I5042 Chronic combined systolic (congestive) and diastolic (congestive) heart failure: Secondary | ICD-10-CM | POA: Diagnosis not present

## 2017-03-05 DIAGNOSIS — Z23 Encounter for immunization: Secondary | ICD-10-CM | POA: Diagnosis not present

## 2017-03-05 DIAGNOSIS — E538 Deficiency of other specified B group vitamins: Secondary | ICD-10-CM | POA: Diagnosis not present

## 2017-03-05 DIAGNOSIS — I10 Essential (primary) hypertension: Secondary | ICD-10-CM | POA: Diagnosis not present

## 2017-03-05 DIAGNOSIS — J449 Chronic obstructive pulmonary disease, unspecified: Secondary | ICD-10-CM | POA: Diagnosis not present

## 2017-03-13 DIAGNOSIS — I10 Essential (primary) hypertension: Secondary | ICD-10-CM | POA: Diagnosis not present

## 2017-04-16 DIAGNOSIS — E871 Hypo-osmolality and hyponatremia: Secondary | ICD-10-CM | POA: Diagnosis not present

## 2017-04-16 DIAGNOSIS — Z23 Encounter for immunization: Secondary | ICD-10-CM | POA: Diagnosis not present

## 2017-04-16 DIAGNOSIS — I739 Peripheral vascular disease, unspecified: Secondary | ICD-10-CM | POA: Diagnosis not present

## 2017-05-04 ENCOUNTER — Emergency Department: Payer: Medicare Other

## 2017-05-04 ENCOUNTER — Inpatient Hospital Stay
Admission: EM | Admit: 2017-05-04 | Discharge: 2017-05-07 | DRG: 871 | Disposition: A | Discharge status: Home Health | Payer: Medicare Other | Attending: Internal Medicine | Admitting: Internal Medicine

## 2017-05-04 ENCOUNTER — Encounter: Payer: Self-pay | Admitting: Emergency Medicine

## 2017-05-04 DIAGNOSIS — Z886 Allergy status to analgesic agent status: Secondary | ICD-10-CM | POA: Diagnosis not present

## 2017-05-04 DIAGNOSIS — J9621 Acute and chronic respiratory failure with hypoxia: Secondary | ICD-10-CM | POA: Diagnosis present

## 2017-05-04 DIAGNOSIS — A419 Sepsis, unspecified organism: Secondary | ICD-10-CM | POA: Diagnosis present

## 2017-05-04 DIAGNOSIS — N390 Urinary tract infection, site not specified: Secondary | ICD-10-CM | POA: Diagnosis not present

## 2017-05-04 DIAGNOSIS — Z882 Allergy status to sulfonamides status: Secondary | ICD-10-CM

## 2017-05-04 DIAGNOSIS — Z9981 Dependence on supplemental oxygen: Secondary | ICD-10-CM

## 2017-05-04 DIAGNOSIS — Z79899 Other long term (current) drug therapy: Secondary | ICD-10-CM

## 2017-05-04 DIAGNOSIS — Z7982 Long term (current) use of aspirin: Secondary | ICD-10-CM

## 2017-05-04 DIAGNOSIS — I2510 Atherosclerotic heart disease of native coronary artery without angina pectoris: Secondary | ICD-10-CM | POA: Diagnosis not present

## 2017-05-04 DIAGNOSIS — I110 Hypertensive heart disease with heart failure: Secondary | ICD-10-CM | POA: Diagnosis present

## 2017-05-04 DIAGNOSIS — I5042 Chronic combined systolic (congestive) and diastolic (congestive) heart failure: Secondary | ICD-10-CM | POA: Diagnosis not present

## 2017-05-04 DIAGNOSIS — J9601 Acute respiratory failure with hypoxia: Secondary | ICD-10-CM | POA: Diagnosis not present

## 2017-05-04 DIAGNOSIS — R0602 Shortness of breath: Secondary | ICD-10-CM | POA: Diagnosis not present

## 2017-05-04 DIAGNOSIS — J449 Chronic obstructive pulmonary disease, unspecified: Secondary | ICD-10-CM | POA: Diagnosis not present

## 2017-05-04 DIAGNOSIS — J441 Chronic obstructive pulmonary disease with (acute) exacerbation: Secondary | ICD-10-CM | POA: Diagnosis not present

## 2017-05-04 DIAGNOSIS — Z87891 Personal history of nicotine dependence: Secondary | ICD-10-CM

## 2017-05-04 DIAGNOSIS — M79606 Pain in leg, unspecified: Secondary | ICD-10-CM | POA: Diagnosis not present

## 2017-05-04 DIAGNOSIS — R221 Localized swelling, mass and lump, neck: Secondary | ICD-10-CM | POA: Diagnosis not present

## 2017-05-04 LAB — URINALYSIS, COMPLETE (UACMP) WITH MICROSCOPIC
Bilirubin Urine: NEGATIVE
GLUCOSE, UA: NEGATIVE mg/dL
KETONES UR: NEGATIVE mg/dL
Nitrite: NEGATIVE
PROTEIN: 100 mg/dL — AB
Specific Gravity, Urine: 1.019 (ref 1.005–1.030)
pH: 5 (ref 5.0–8.0)

## 2017-05-04 LAB — CBC WITH DIFFERENTIAL/PLATELET
Basophils Absolute: 0.1 10*3/uL (ref 0–0.1)
Basophils Relative: 0 %
EOS ABS: 0 10*3/uL (ref 0–0.7)
EOS PCT: 0 %
HCT: 41.3 % (ref 35.0–47.0)
Hemoglobin: 13.8 g/dL (ref 12.0–16.0)
Lymphocytes Relative: 6 %
Lymphs Abs: 1 10*3/uL (ref 1.0–3.6)
MCH: 30.6 pg (ref 26.0–34.0)
MCHC: 33.4 g/dL (ref 32.0–36.0)
MCV: 91.6 fL (ref 80.0–100.0)
MONO ABS: 2 10*3/uL — AB (ref 0.2–0.9)
Monocytes Relative: 11 %
Neutro Abs: 15 10*3/uL — ABNORMAL HIGH (ref 1.4–6.5)
Neutrophils Relative %: 83 %
PLATELETS: 194 10*3/uL (ref 150–440)
RBC: 4.51 MIL/uL (ref 3.80–5.20)
RDW: 14.3 % (ref 11.5–14.5)
WBC: 18.2 10*3/uL — ABNORMAL HIGH (ref 3.6–11.0)

## 2017-05-04 LAB — BASIC METABOLIC PANEL
Anion gap: 10 (ref 5–15)
BUN: 30 mg/dL — ABNORMAL HIGH (ref 6–20)
CALCIUM: 9 mg/dL (ref 8.9–10.3)
CO2: 28 mmol/L (ref 22–32)
CREATININE: 1.15 mg/dL — AB (ref 0.44–1.00)
Chloride: 104 mmol/L (ref 101–111)
GFR, EST AFRICAN AMERICAN: 51 mL/min — AB (ref >60)
GFR, EST NON AFRICAN AMERICAN: 44 mL/min — AB (ref >60)
GLUCOSE: 139 mg/dL — AB (ref 65–99)
Potassium: 4.1 mmol/L (ref 3.5–5.1)
Sodium: 142 mmol/L (ref 135–145)

## 2017-05-04 LAB — TROPONIN I: Troponin I: 0.03 ng/mL (ref <0.03)

## 2017-05-04 LAB — LACTIC ACID, PLASMA: Lactic Acid, Venous: 1.3 mmol/L (ref 0.5–1.9)

## 2017-05-04 LAB — BRAIN NATRIURETIC PEPTIDE: B NATRIURETIC PEPTIDE 5: 370 pg/mL — AB (ref 0.0–100.0)

## 2017-05-04 MED ORDER — METOPROLOL SUCCINATE ER 50 MG PO TB24
50.0000 mg | ORAL_TABLET | Freq: Every day | ORAL | Status: DC
  Administered 2017-05-05 – 2017-05-07 (×3): 50 mg via ORAL
  Filled 2017-05-04 (×3): qty 1

## 2017-05-04 MED ORDER — ACETAMINOPHEN 500 MG PO TABS
ORAL_TABLET | ORAL | Status: AC
  Filled 2017-05-04: qty 2

## 2017-05-04 MED ORDER — DEXTROSE 5 % IV SOLN
1.0000 g | INTRAVENOUS | Status: DC
  Administered 2017-05-05 – 2017-05-07 (×3): 1 g via INTRAVENOUS
  Filled 2017-05-04 (×4): qty 10

## 2017-05-04 MED ORDER — OXYCODONE HCL 5 MG PO TABS
5.0000 mg | ORAL_TABLET | ORAL | Status: DC | PRN
  Administered 2017-05-04 – 2017-05-06 (×4): 5 mg via ORAL
  Filled 2017-05-04 (×4): qty 1

## 2017-05-04 MED ORDER — TIOTROPIUM BROMIDE MONOHYDRATE 18 MCG IN CAPS
18.0000 ug | ORAL_CAPSULE | Freq: Every day | RESPIRATORY_TRACT | Status: DC
  Administered 2017-05-04 – 2017-05-06 (×3): 18 ug via RESPIRATORY_TRACT
  Filled 2017-05-04: qty 5

## 2017-05-04 MED ORDER — ACETAMINOPHEN 325 MG PO TABS
650.0000 mg | ORAL_TABLET | Freq: Four times a day (QID) | ORAL | Status: DC | PRN
  Administered 2017-05-05: 10:00:00 650 mg via ORAL
  Filled 2017-05-04: qty 2

## 2017-05-04 MED ORDER — ORAL CARE MOUTH RINSE
15.0000 mL | Freq: Two times a day (BID) | OROMUCOSAL | Status: DC
  Administered 2017-05-04 – 2017-05-06 (×5): 15 mL via OROMUCOSAL

## 2017-05-04 MED ORDER — AMLODIPINE BESYLATE 10 MG PO TABS
10.0000 mg | ORAL_TABLET | Freq: Every day | ORAL | Status: DC
  Administered 2017-05-05 – 2017-05-07 (×3): 10 mg via ORAL
  Filled 2017-05-04 (×3): qty 1

## 2017-05-04 MED ORDER — SODIUM CHLORIDE 0.9 % IV BOLUS (SEPSIS)
1000.0000 mL | Freq: Once | INTRAVENOUS | Status: AC
  Administered 2017-05-04: 1000 mL via INTRAVENOUS

## 2017-05-04 MED ORDER — POTASSIUM CHLORIDE ER 10 MEQ PO TBCR
10.0000 meq | EXTENDED_RELEASE_TABLET | Freq: Every day | ORAL | Status: DC
  Administered 2017-05-05 – 2017-05-07 (×3): 10 meq via ORAL
  Filled 2017-05-04 (×6): qty 1

## 2017-05-04 MED ORDER — BUDESONIDE 0.5 MG/2ML IN SUSP
0.5000 mg | Freq: Two times a day (BID) | RESPIRATORY_TRACT | Status: DC
  Administered 2017-05-04 – 2017-05-07 (×6): 0.5 mg via RESPIRATORY_TRACT
  Filled 2017-05-04 (×6): qty 2

## 2017-05-04 MED ORDER — ASPIRIN EC 81 MG PO TBEC
81.0000 mg | DELAYED_RELEASE_TABLET | Freq: Every day | ORAL | Status: DC
  Administered 2017-05-04 – 2017-05-07 (×4): 81 mg via ORAL
  Filled 2017-05-04 (×4): qty 1

## 2017-05-04 MED ORDER — NICOTINE 21 MG/24HR TD PT24
21.0000 mg | MEDICATED_PATCH | Freq: Every day | TRANSDERMAL | Status: DC
  Filled 2017-05-04 (×4): qty 1

## 2017-05-04 MED ORDER — IPRATROPIUM-ALBUTEROL 0.5-2.5 (3) MG/3ML IN SOLN
3.0000 mL | Freq: Four times a day (QID) | RESPIRATORY_TRACT | Status: DC
  Administered 2017-05-04 (×2): 3 mL via RESPIRATORY_TRACT
  Filled 2017-05-04 (×2): qty 3

## 2017-05-04 MED ORDER — ACETAMINOPHEN 500 MG PO TABS
1000.0000 mg | ORAL_TABLET | Freq: Once | ORAL | Status: AC
  Administered 2017-05-04: 1000 mg via ORAL

## 2017-05-04 MED ORDER — ENOXAPARIN SODIUM 40 MG/0.4ML ~~LOC~~ SOLN
40.0000 mg | SUBCUTANEOUS | Status: DC
  Administered 2017-05-04 – 2017-05-06 (×3): 40 mg via SUBCUTANEOUS
  Filled 2017-05-04 (×3): qty 0.4

## 2017-05-04 MED ORDER — ONDANSETRON HCL 4 MG/2ML IJ SOLN: 4.0000 mg | Freq: Four times a day (QID) | INTRAMUSCULAR | Status: DC | PRN

## 2017-05-04 MED ORDER — ACETAMINOPHEN 650 MG RE SUPP: 650.0000 mg | Freq: Four times a day (QID) | RECTAL | Status: DC | PRN

## 2017-05-04 MED ORDER — FUROSEMIDE 20 MG PO TABS
20.0000 mg | ORAL_TABLET | Freq: Every day | ORAL | Status: DC
  Administered 2017-05-05 – 2017-05-07 (×3): 20 mg via ORAL
  Filled 2017-05-04 (×3): qty 1

## 2017-05-04 MED ORDER — DEXTROSE 5 % IV SOLN
1.0000 g | Freq: Once | INTRAVENOUS | Status: AC
  Administered 2017-05-04: 1 g via INTRAVENOUS
  Filled 2017-05-04: qty 10

## 2017-05-04 MED ORDER — ONDANSETRON HCL 4 MG PO TABS: 4.0000 mg | ORAL_TABLET | Freq: Four times a day (QID) | ORAL | Status: DC | PRN
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
## 2017-05-04 NOTE — Plan of Care (Signed)
Problem: Pain Managment: Goal: General experience of comfort will improve Outcome: Progressing Oxycodone given for acute on chronic knee pain with improvement.  Problem: Physical Regulation: Goal: Ability to maintain clinical measurements within normal limits will improve Pt admitted from the ED. VSS. O2 sats in the mis 90's on 2L O2 per [place]. Pt uses 2L O2 prn at home per pt.

## 2017-05-04 NOTE — ED Triage Notes (Signed)
Patient from home via [facility]. Reports she has increased leg swelling as well as back pain since Wednesday. Patient denies history of CHF but reports taking a fluid pill. Good pulses noted to feet bilaterally. Patient A&O x4. Patient 89% on RA. Reports intermittent use of home O2. Patient placed on 2L [place].

## 2017-05-05 DIAGNOSIS — A419 Sepsis, unspecified organism: Secondary | ICD-10-CM | POA: Diagnosis not present

## 2017-05-05 DIAGNOSIS — I5042 Chronic combined systolic (congestive) and diastolic (congestive) heart failure: Secondary | ICD-10-CM | POA: Diagnosis not present

## 2017-05-05 DIAGNOSIS — J449 Chronic obstructive pulmonary disease, unspecified: Secondary | ICD-10-CM | POA: Diagnosis not present

## 2017-05-05 DIAGNOSIS — J9621 Acute and chronic respiratory failure with hypoxia: Secondary | ICD-10-CM | POA: Diagnosis not present

## 2017-05-05 DIAGNOSIS — R0602 Shortness of breath: Secondary | ICD-10-CM | POA: Diagnosis not present

## 2017-05-05 DIAGNOSIS — J9601 Acute respiratory failure with hypoxia: Secondary | ICD-10-CM | POA: Diagnosis not present

## 2017-05-05 DIAGNOSIS — J441 Chronic obstructive pulmonary disease with (acute) exacerbation: Secondary | ICD-10-CM | POA: Diagnosis not present

## 2017-05-05 DIAGNOSIS — N390 Urinary tract infection, site not specified: Secondary | ICD-10-CM | POA: Diagnosis not present

## 2017-05-05 DIAGNOSIS — I110 Hypertensive heart disease with heart failure: Secondary | ICD-10-CM | POA: Diagnosis not present

## 2017-05-05 LAB — CBC
HCT: 39.6 % (ref 35.0–47.0)
Hemoglobin: 13.1 g/dL (ref 12.0–16.0)
MCH: 30.9 pg (ref 26.0–34.0)
MCHC: 33.2 g/dL (ref 32.0–36.0)
MCV: 93.1 fL (ref 80.0–100.0)
PLATELETS: 177 10*3/uL (ref 150–440)
RBC: 4.26 MIL/uL (ref 3.80–5.20)
RDW: 14.1 % (ref 11.5–14.5)
WBC: 11.8 10*3/uL — ABNORMAL HIGH (ref 3.6–11.0)

## 2017-05-05 LAB — BASIC METABOLIC PANEL
Anion gap: 7 (ref 5–15)
BUN: 33 mg/dL — AB (ref 6–20)
CALCIUM: 8.2 mg/dL — AB (ref 8.9–10.3)
CHLORIDE: 108 mmol/L (ref 101–111)
CO2: 27 mmol/L (ref 22–32)
CREATININE: 1.01 mg/dL — AB (ref 0.44–1.00)
GFR calc Af Amer: 59 mL/min — ABNORMAL LOW (ref >60)
GFR calc non Af Amer: 51 mL/min — ABNORMAL LOW (ref >60)
GLUCOSE: 100 mg/dL — AB (ref 65–99)
Potassium: 4 mmol/L (ref 3.5–5.1)
Sodium: 142 mmol/L (ref 135–145)

## 2017-05-05 MED ORDER — METHYLPREDNISOLONE SODIUM SUCC 125 MG IJ SOLR
60.0000 mg | Freq: Two times a day (BID) | INTRAMUSCULAR | Status: DC
Start: 2017-05-05 — End: 2017-05-06
  Administered 2017-05-05 – 2017-05-06 (×2): 60 mg via INTRAVENOUS
  Filled 2017-05-05 (×2): qty 2

## 2017-05-05 MED ORDER — ALBUTEROL SULFATE (2.5 MG/3ML) 0.083% IN NEBU
2.5000 mg | INHALATION_SOLUTION | Freq: Three times a day (TID) | RESPIRATORY_TRACT | Status: DC
  Administered 2017-05-05 – 2017-05-06 (×2): 2.5 mg via RESPIRATORY_TRACT
  Filled 2017-05-05 (×2): qty 3

## 2017-05-05 MED ORDER — IPRATROPIUM-ALBUTEROL 0.5-2.5 (3) MG/3ML IN SOLN
3.0000 mL | Freq: Three times a day (TID) | RESPIRATORY_TRACT | Status: DC
  Administered 2017-05-05 (×2): 3 mL via RESPIRATORY_TRACT
  Filled 2017-05-05 (×2): qty 3

## 2017-05-05 MED ORDER — IPRATROPIUM-ALBUTEROL 0.5-2.5 (3) MG/3ML IN SOLN: 3.0000 mL | Freq: Four times a day (QID) | RESPIRATORY_TRACT | Status: DC | PRN
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
## 2017-05-05 NOTE — Progress Notes (Signed)
Medications given by student RN [id number] with supervision of Clinical Instructor Jacey Pelc, MSN, RN-BC. 

## 2017-05-06 DIAGNOSIS — I5042 Chronic combined systolic (congestive) and diastolic (congestive) heart failure: Secondary | ICD-10-CM | POA: Diagnosis not present

## 2017-05-06 DIAGNOSIS — N390 Urinary tract infection, site not specified: Secondary | ICD-10-CM | POA: Diagnosis not present

## 2017-05-06 DIAGNOSIS — J449 Chronic obstructive pulmonary disease, unspecified: Secondary | ICD-10-CM | POA: Diagnosis not present

## 2017-05-06 DIAGNOSIS — J9621 Acute and chronic respiratory failure with hypoxia: Secondary | ICD-10-CM | POA: Diagnosis not present

## 2017-05-06 DIAGNOSIS — I110 Hypertensive heart disease with heart failure: Secondary | ICD-10-CM | POA: Diagnosis not present

## 2017-05-06 DIAGNOSIS — R0602 Shortness of breath: Secondary | ICD-10-CM | POA: Diagnosis not present

## 2017-05-06 DIAGNOSIS — J441 Chronic obstructive pulmonary disease with (acute) exacerbation: Secondary | ICD-10-CM | POA: Diagnosis not present

## 2017-05-06 DIAGNOSIS — J9601 Acute respiratory failure with hypoxia: Secondary | ICD-10-CM | POA: Diagnosis not present

## 2017-05-06 DIAGNOSIS — A419 Sepsis, unspecified organism: Secondary | ICD-10-CM | POA: Diagnosis not present

## 2017-05-06 MED ORDER — IPRATROPIUM-ALBUTEROL 0.5-2.5 (3) MG/3ML IN SOLN
3.0000 mL | Freq: Four times a day (QID) | RESPIRATORY_TRACT | Status: DC
  Administered 2017-05-06 – 2017-05-07 (×5): 3 mL via RESPIRATORY_TRACT
  Filled 2017-05-06 (×5): qty 3

## 2017-05-06 MED ORDER — ALBUTEROL SULFATE (2.5 MG/3ML) 0.083% IN NEBU
2.5000 mg | INHALATION_SOLUTION | Freq: Four times a day (QID) | RESPIRATORY_TRACT | Status: DC | PRN
Start: 2017-05-06 — End: 2017-05-07

## 2017-05-06 MED ORDER — METHYLPREDNISOLONE SODIUM SUCC 125 MG IJ SOLR
60.0000 mg | Freq: Four times a day (QID) | INTRAMUSCULAR | Status: DC
Start: 2017-05-06 — End: 2017-05-07
  Administered 2017-05-06 – 2017-05-07 (×4): 60 mg via INTRAVENOUS
  Filled 2017-05-06 (×4): qty 2
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
## 2017-05-07 DIAGNOSIS — I5042 Chronic combined systolic (congestive) and diastolic (congestive) heart failure: Secondary | ICD-10-CM | POA: Diagnosis not present

## 2017-05-07 DIAGNOSIS — J9601 Acute respiratory failure with hypoxia: Secondary | ICD-10-CM | POA: Diagnosis not present

## 2017-05-07 DIAGNOSIS — R0602 Shortness of breath: Secondary | ICD-10-CM | POA: Diagnosis not present

## 2017-05-07 DIAGNOSIS — J449 Chronic obstructive pulmonary disease, unspecified: Secondary | ICD-10-CM | POA: Diagnosis not present

## 2017-05-07 DIAGNOSIS — A419 Sepsis, unspecified organism: Secondary | ICD-10-CM | POA: Diagnosis not present

## 2017-05-07 DIAGNOSIS — I110 Hypertensive heart disease with heart failure: Secondary | ICD-10-CM | POA: Diagnosis not present

## 2017-05-07 DIAGNOSIS — J9621 Acute and chronic respiratory failure with hypoxia: Secondary | ICD-10-CM | POA: Diagnosis not present

## 2017-05-07 DIAGNOSIS — N390 Urinary tract infection, site not specified: Secondary | ICD-10-CM | POA: Diagnosis not present

## 2017-05-07 DIAGNOSIS — J441 Chronic obstructive pulmonary disease with (acute) exacerbation: Secondary | ICD-10-CM | POA: Diagnosis not present

## 2017-05-07 LAB — CBC
HCT: 38.2 % (ref 35.0–47.0)
HEMOGLOBIN: 12.7 g/dL (ref 12.0–16.0)
MCH: 30.4 pg (ref 26.0–34.0)
MCHC: 33.1 g/dL (ref 32.0–36.0)
MCV: 91.9 fL (ref 80.0–100.0)
Platelets: 280 10*3/uL (ref 150–440)
RBC: 4.16 MIL/uL (ref 3.80–5.20)
RDW: 14.2 % (ref 11.5–14.5)
WBC: 14.7 10*3/uL — ABNORMAL HIGH (ref 3.6–11.0)

## 2017-05-07 LAB — BASIC METABOLIC PANEL
ANION GAP: 7 (ref 5–15)
BUN: 43 mg/dL — AB (ref 6–20)
CHLORIDE: 105 mmol/L (ref 101–111)
CO2: 28 mmol/L (ref 22–32)
CREATININE: 1.3 mg/dL — AB (ref 0.44–1.00)
Calcium: 8.7 mg/dL — ABNORMAL LOW (ref 8.9–10.3)
GFR calc Af Amer: 44 mL/min — ABNORMAL LOW (ref >60)
GFR, EST NON AFRICAN AMERICAN: 38 mL/min — AB (ref >60)
GLUCOSE: 193 mg/dL — AB (ref 65–99)
POTASSIUM: 4.9 mmol/L (ref 3.5–5.1)
Sodium: 140 mmol/L (ref 135–145)

## 2017-05-07 MED ORDER — LEVOFLOXACIN 500 MG PO TABS: 500.0000 mg | ORAL_TABLET | Freq: Every day | ORAL | 0 refills | Status: DC

## 2017-05-07 MED ORDER — PREDNISONE 10 MG (21) PO TBPK: 10.0000 mg | ORAL_TABLET | Freq: Every day | ORAL | 0 refills | Status: DC

## 2017-05-07 NOTE — Care Management Important Message (Signed)
Important Message  Patient Details  Name: Helen Moore MRN: [id number] Date of Birth: 07-31-1935   Medicare Important Message Given:  Yes    Gwenette Greet, RN 05/07/2017, 8:07 AM

## 2017-05-07 NOTE — Progress Notes (Signed)
Pt is being discharged home with [facility]. Walker given. Discharge papers given and explained to pt, verbalized understanding. Meds and f/u appointment reviewed with pt. RX to be picked up from pharmacy. Pt is aware.

## 2017-05-07 NOTE — Discharge Instructions (Signed)
Resume diet, oxygen and activity as before

## 2017-05-07 NOTE — Care Management Note (Signed)
Case Management Note  Patient Details  Name: Samaiya Goodwine MRN: [id number] Date of Birth: 1934-10-15  Subjective/Objective:        Admitted to [facility] with the diagnosis of sepsis. Lives alone. Sister is Jason Coop [phone]). Last seen Dr. Greggory Stallion a month ago. Prescriptions are filled at [facility] in [place]. [facility] a year ago. No skilled nursing facility. Home oxygen per [facility] x 6 months. Uses 2 liters per nasal cannula prn. Takes care of all basic activities of daily living herself, doesn't drive. [place] and sister sometimes helps with basic needs. Family helps with errands. No falls. Appetite comes and goes. Sister will transport  Discharge to home today per Dr. Elpidio Anis           Action/Plan: Physical therapy evaluation completed. Recommending home with home health and therapy. Chose [facility]. Feliberto Gottron, [facility] representative updated. Will get rolling walker for home.   Expected Discharge Date:  05/07/17               Expected Discharge Plan:     In-House Referral:     Discharge planning Services     Post Acute Care Choice:    Choice offered to:   patient  DME Arranged:   yes DME Agency:   [facility]  HH Arranged:   yes HH Agency:   [facility]   Status of Service:     If discussed at Long Length of Stay Meetings, dates discussed:    Additional Comments:  Gwenette Greet, RN MSN CCM Care Management [phone] 05/07/2017, 12:12 PM

## 2017-05-09 DIAGNOSIS — Z87828 Personal history of other (healed) physical injury and trauma: Secondary | ICD-10-CM | POA: Diagnosis not present

## 2017-05-09 DIAGNOSIS — Z87891 Personal history of nicotine dependence: Secondary | ICD-10-CM | POA: Diagnosis not present

## 2017-05-09 DIAGNOSIS — I110 Hypertensive heart disease with heart failure: Secondary | ICD-10-CM | POA: Diagnosis not present

## 2017-05-09 DIAGNOSIS — N390 Urinary tract infection, site not specified: Secondary | ICD-10-CM | POA: Diagnosis not present

## 2017-05-09 DIAGNOSIS — Z7982 Long term (current) use of aspirin: Secondary | ICD-10-CM | POA: Diagnosis not present

## 2017-05-09 DIAGNOSIS — J9620 Acute and chronic respiratory failure, unspecified whether with hypoxia or hypercapnia: Secondary | ICD-10-CM | POA: Diagnosis not present

## 2017-05-09 DIAGNOSIS — I5042 Chronic combined systolic (congestive) and diastolic (congestive) heart failure: Secondary | ICD-10-CM | POA: Diagnosis not present

## 2017-05-09 DIAGNOSIS — I2510 Atherosclerotic heart disease of native coronary artery without angina pectoris: Secondary | ICD-10-CM | POA: Diagnosis not present

## 2017-05-09 DIAGNOSIS — M1990 Unspecified osteoarthritis, unspecified site: Secondary | ICD-10-CM | POA: Diagnosis not present

## 2017-05-09 DIAGNOSIS — J441 Chronic obstructive pulmonary disease with (acute) exacerbation: Secondary | ICD-10-CM | POA: Diagnosis not present

## 2017-05-09 LAB — CULTURE, BLOOD (ROUTINE X 2)
CULTURE: NO GROWTH
Culture: NO GROWTH
Special Requests: ADEQUATE
Special Requests: ADEQUATE
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
## 2017-05-12 DIAGNOSIS — I110 Hypertensive heart disease with heart failure: Secondary | ICD-10-CM | POA: Diagnosis not present

## 2017-05-12 DIAGNOSIS — J441 Chronic obstructive pulmonary disease with (acute) exacerbation: Secondary | ICD-10-CM | POA: Diagnosis not present

## 2017-05-12 DIAGNOSIS — I2510 Atherosclerotic heart disease of native coronary artery without angina pectoris: Secondary | ICD-10-CM | POA: Diagnosis not present

## 2017-05-12 DIAGNOSIS — J9620 Acute and chronic respiratory failure, unspecified whether with hypoxia or hypercapnia: Secondary | ICD-10-CM | POA: Diagnosis not present

## 2017-05-12 DIAGNOSIS — I5042 Chronic combined systolic (congestive) and diastolic (congestive) heart failure: Secondary | ICD-10-CM | POA: Diagnosis not present

## 2017-05-12 DIAGNOSIS — N390 Urinary tract infection, site not specified: Secondary | ICD-10-CM | POA: Diagnosis not present

## 2017-05-13 DIAGNOSIS — I110 Hypertensive heart disease with heart failure: Secondary | ICD-10-CM | POA: Diagnosis not present

## 2017-05-13 DIAGNOSIS — J441 Chronic obstructive pulmonary disease with (acute) exacerbation: Secondary | ICD-10-CM | POA: Diagnosis not present

## 2017-05-13 DIAGNOSIS — I2510 Atherosclerotic heart disease of native coronary artery without angina pectoris: Secondary | ICD-10-CM | POA: Diagnosis not present

## 2017-05-13 DIAGNOSIS — N390 Urinary tract infection, site not specified: Secondary | ICD-10-CM | POA: Diagnosis not present

## 2017-05-13 DIAGNOSIS — J9620 Acute and chronic respiratory failure, unspecified whether with hypoxia or hypercapnia: Secondary | ICD-10-CM | POA: Diagnosis not present

## 2017-05-13 DIAGNOSIS — I5042 Chronic combined systolic (congestive) and diastolic (congestive) heart failure: Secondary | ICD-10-CM | POA: Diagnosis not present

## 2017-05-14 DIAGNOSIS — G8929 Other chronic pain: Secondary | ICD-10-CM | POA: Diagnosis not present

## 2017-05-14 DIAGNOSIS — M545 Low back pain: Secondary | ICD-10-CM | POA: Diagnosis not present

## 2017-05-14 DIAGNOSIS — R899 Unspecified abnormal finding in specimens from other organs, systems and tissues: Secondary | ICD-10-CM | POA: Diagnosis not present

## 2017-05-14 DIAGNOSIS — Z09 Encounter for follow-up examination after completed treatment for conditions other than malignant neoplasm: Secondary | ICD-10-CM | POA: Diagnosis not present

## 2017-05-15 ENCOUNTER — Encounter: Payer: Self-pay | Admitting: Family

## 2017-05-15 ENCOUNTER — Ambulatory Visit: Payer: Medicare Other | Attending: Family | Admitting: Family

## 2017-05-15 VITALS — BP 139/73 | HR 88 | Resp 18 | Ht 66.0 in | Wt 153.4 lb

## 2017-05-15 DIAGNOSIS — E785 Hyperlipidemia, unspecified: Secondary | ICD-10-CM | POA: Insufficient documentation

## 2017-05-15 DIAGNOSIS — F17210 Nicotine dependence, cigarettes, uncomplicated: Secondary | ICD-10-CM | POA: Diagnosis not present

## 2017-05-15 DIAGNOSIS — Z7982 Long term (current) use of aspirin: Secondary | ICD-10-CM | POA: Insufficient documentation

## 2017-05-15 DIAGNOSIS — Z79899 Other long term (current) drug therapy: Secondary | ICD-10-CM | POA: Diagnosis not present

## 2017-05-15 DIAGNOSIS — I10 Essential (primary) hypertension: Secondary | ICD-10-CM

## 2017-05-15 DIAGNOSIS — J449 Chronic obstructive pulmonary disease, unspecified: Secondary | ICD-10-CM | POA: Diagnosis not present

## 2017-05-15 DIAGNOSIS — I2510 Atherosclerotic heart disease of native coronary artery without angina pectoris: Secondary | ICD-10-CM | POA: Diagnosis not present

## 2017-05-15 DIAGNOSIS — I272 Pulmonary hypertension, unspecified: Secondary | ICD-10-CM | POA: Insufficient documentation

## 2017-05-15 DIAGNOSIS — I110 Hypertensive heart disease with heart failure: Secondary | ICD-10-CM | POA: Insufficient documentation

## 2017-05-15 DIAGNOSIS — I5032 Chronic diastolic (congestive) heart failure: Secondary | ICD-10-CM | POA: Insufficient documentation

## 2017-05-15 DIAGNOSIS — Z720 Tobacco use: Secondary | ICD-10-CM

## 2017-05-15 NOTE — Patient Instructions (Signed)
Continue weighing daily and call for an overnight weight gain of > 2 pounds or a weekly weight gain of >5 pounds. 
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
## 2017-05-16 DIAGNOSIS — J441 Chronic obstructive pulmonary disease with (acute) exacerbation: Secondary | ICD-10-CM | POA: Diagnosis not present

## 2017-05-16 DIAGNOSIS — M545 Low back pain: Secondary | ICD-10-CM | POA: Diagnosis not present

## 2017-05-16 DIAGNOSIS — G8929 Other chronic pain: Secondary | ICD-10-CM | POA: Diagnosis not present

## 2017-05-16 DIAGNOSIS — I5042 Chronic combined systolic (congestive) and diastolic (congestive) heart failure: Secondary | ICD-10-CM | POA: Diagnosis not present

## 2017-05-16 DIAGNOSIS — Z09 Encounter for follow-up examination after completed treatment for conditions other than malignant neoplasm: Secondary | ICD-10-CM | POA: Diagnosis not present

## 2017-05-16 DIAGNOSIS — R899 Unspecified abnormal finding in specimens from other organs, systems and tissues: Secondary | ICD-10-CM | POA: Diagnosis not present

## 2017-05-16 DIAGNOSIS — I110 Hypertensive heart disease with heart failure: Secondary | ICD-10-CM | POA: Diagnosis not present

## 2017-05-16 DIAGNOSIS — I2510 Atherosclerotic heart disease of native coronary artery without angina pectoris: Secondary | ICD-10-CM | POA: Diagnosis not present

## 2017-05-16 DIAGNOSIS — J9620 Acute and chronic respiratory failure, unspecified whether with hypoxia or hypercapnia: Secondary | ICD-10-CM | POA: Diagnosis not present

## 2017-05-16 DIAGNOSIS — N390 Urinary tract infection, site not specified: Secondary | ICD-10-CM | POA: Diagnosis not present

## 2017-05-20 DIAGNOSIS — I110 Hypertensive heart disease with heart failure: Secondary | ICD-10-CM | POA: Diagnosis not present

## 2017-05-20 DIAGNOSIS — I5042 Chronic combined systolic (congestive) and diastolic (congestive) heart failure: Secondary | ICD-10-CM | POA: Diagnosis not present

## 2017-05-20 DIAGNOSIS — N390 Urinary tract infection, site not specified: Secondary | ICD-10-CM | POA: Diagnosis not present

## 2017-05-20 DIAGNOSIS — I2510 Atherosclerotic heart disease of native coronary artery without angina pectoris: Secondary | ICD-10-CM | POA: Diagnosis not present

## 2017-05-20 DIAGNOSIS — J9620 Acute and chronic respiratory failure, unspecified whether with hypoxia or hypercapnia: Secondary | ICD-10-CM | POA: Diagnosis not present

## 2017-05-20 DIAGNOSIS — J441 Chronic obstructive pulmonary disease with (acute) exacerbation: Secondary | ICD-10-CM | POA: Diagnosis not present

## 2017-05-21 ENCOUNTER — Ambulatory Visit (INDEPENDENT_AMBULATORY_CARE_PROVIDER_SITE_OTHER): Payer: Medicare Other | Admitting: Cardiovascular Disease

## 2017-05-21 ENCOUNTER — Encounter: Payer: Self-pay | Admitting: Cardiovascular Disease

## 2017-05-21 VITALS — BP 116/70 | HR 110 | Ht 65.0 in | Wt 156.0 lb

## 2017-05-21 DIAGNOSIS — I5032 Chronic diastolic (congestive) heart failure: Secondary | ICD-10-CM

## 2017-05-21 DIAGNOSIS — I10 Essential (primary) hypertension: Secondary | ICD-10-CM

## 2017-05-21 DIAGNOSIS — I700 Atherosclerosis of aorta: Secondary | ICD-10-CM

## 2017-05-21 DIAGNOSIS — I209 Angina pectoris, unspecified: Secondary | ICD-10-CM | POA: Diagnosis not present

## 2017-05-21 DIAGNOSIS — E782 Mixed hyperlipidemia: Secondary | ICD-10-CM | POA: Diagnosis not present

## 2017-05-21 DIAGNOSIS — I272 Pulmonary hypertension, unspecified: Secondary | ICD-10-CM | POA: Diagnosis not present

## 2017-05-21 DIAGNOSIS — I25118 Atherosclerotic heart disease of native coronary artery with other forms of angina pectoris: Secondary | ICD-10-CM | POA: Diagnosis not present

## 2017-05-21 MED ORDER — AMLODIPINE BESYLATE 5 MG PO TABS: 5.0000 mg | ORAL_TABLET | Freq: Every day | ORAL | 6 refills | Status: DC

## 2017-05-21 MED ORDER — METOPROLOL SUCCINATE ER 50 MG PO TB24: 50.0000 mg | ORAL_TABLET | Freq: Two times a day (BID) | ORAL | 11 refills | Status: AC

## 2017-05-21 NOTE — Patient Instructions (Addendum)
Medication Instructions:   Please increase the metoprolol up to one pill twice a day  Please decrease the amlodipine down to 5 mg daily (cut in 1/2 daily)  Labwork:  No new labs needed  Testing/Procedures:  No further testing at this time   Follow-Up: It was a pleasure seeing you in the office today. Please call us if you have new issues that need to be addressed before your next appt.  [phone]  Your physician wants you to follow-up in: 6 months.  You will receive a reminder letter in the mail two months in advance. If you don't receive a letter, please call our office to schedule the follow-up appointment.  If you need a refill on your cardiac medications before your next appointment, please call your pharmacy.
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
## 2017-05-22 DIAGNOSIS — I110 Hypertensive heart disease with heart failure: Secondary | ICD-10-CM | POA: Diagnosis not present

## 2017-05-22 DIAGNOSIS — J441 Chronic obstructive pulmonary disease with (acute) exacerbation: Secondary | ICD-10-CM | POA: Diagnosis not present

## 2017-05-22 DIAGNOSIS — N390 Urinary tract infection, site not specified: Secondary | ICD-10-CM | POA: Diagnosis not present

## 2017-05-22 DIAGNOSIS — J9620 Acute and chronic respiratory failure, unspecified whether with hypoxia or hypercapnia: Secondary | ICD-10-CM | POA: Diagnosis not present

## 2017-05-22 DIAGNOSIS — I2510 Atherosclerotic heart disease of native coronary artery without angina pectoris: Secondary | ICD-10-CM | POA: Diagnosis not present

## 2017-05-22 DIAGNOSIS — I5042 Chronic combined systolic (congestive) and diastolic (congestive) heart failure: Secondary | ICD-10-CM | POA: Diagnosis not present

## 2017-05-23 DIAGNOSIS — J9620 Acute and chronic respiratory failure, unspecified whether with hypoxia or hypercapnia: Secondary | ICD-10-CM | POA: Diagnosis not present

## 2017-05-23 DIAGNOSIS — J441 Chronic obstructive pulmonary disease with (acute) exacerbation: Secondary | ICD-10-CM | POA: Diagnosis not present

## 2017-05-23 DIAGNOSIS — I5042 Chronic combined systolic (congestive) and diastolic (congestive) heart failure: Secondary | ICD-10-CM | POA: Diagnosis not present

## 2017-05-23 DIAGNOSIS — I2510 Atherosclerotic heart disease of native coronary artery without angina pectoris: Secondary | ICD-10-CM | POA: Diagnosis not present

## 2017-05-23 DIAGNOSIS — N390 Urinary tract infection, site not specified: Secondary | ICD-10-CM | POA: Diagnosis not present

## 2017-05-23 DIAGNOSIS — I110 Hypertensive heart disease with heart failure: Secondary | ICD-10-CM | POA: Diagnosis not present

## 2017-05-26 DIAGNOSIS — J441 Chronic obstructive pulmonary disease with (acute) exacerbation: Secondary | ICD-10-CM | POA: Diagnosis not present

## 2017-05-26 DIAGNOSIS — I110 Hypertensive heart disease with heart failure: Secondary | ICD-10-CM | POA: Diagnosis not present

## 2017-05-26 DIAGNOSIS — J9620 Acute and chronic respiratory failure, unspecified whether with hypoxia or hypercapnia: Secondary | ICD-10-CM | POA: Diagnosis not present

## 2017-05-26 DIAGNOSIS — I2510 Atherosclerotic heart disease of native coronary artery without angina pectoris: Secondary | ICD-10-CM | POA: Diagnosis not present

## 2017-05-26 DIAGNOSIS — I5042 Chronic combined systolic (congestive) and diastolic (congestive) heart failure: Secondary | ICD-10-CM | POA: Diagnosis not present

## 2017-05-26 DIAGNOSIS — N390 Urinary tract infection, site not specified: Secondary | ICD-10-CM | POA: Diagnosis not present

## 2017-06-03 DIAGNOSIS — J9620 Acute and chronic respiratory failure, unspecified whether with hypoxia or hypercapnia: Secondary | ICD-10-CM | POA: Diagnosis not present

## 2017-06-03 DIAGNOSIS — N390 Urinary tract infection, site not specified: Secondary | ICD-10-CM | POA: Diagnosis not present

## 2017-06-03 DIAGNOSIS — I5042 Chronic combined systolic (congestive) and diastolic (congestive) heart failure: Secondary | ICD-10-CM | POA: Diagnosis not present

## 2017-06-03 DIAGNOSIS — I2510 Atherosclerotic heart disease of native coronary artery without angina pectoris: Secondary | ICD-10-CM | POA: Diagnosis not present

## 2017-06-03 DIAGNOSIS — J441 Chronic obstructive pulmonary disease with (acute) exacerbation: Secondary | ICD-10-CM | POA: Diagnosis not present

## 2017-06-03 DIAGNOSIS — I110 Hypertensive heart disease with heart failure: Secondary | ICD-10-CM | POA: Diagnosis not present

## 2017-06-05 DIAGNOSIS — I10 Essential (primary) hypertension: Secondary | ICD-10-CM | POA: Diagnosis not present

## 2017-06-18 DIAGNOSIS — N390 Urinary tract infection, site not specified: Secondary | ICD-10-CM | POA: Diagnosis not present

## 2017-06-18 DIAGNOSIS — J441 Chronic obstructive pulmonary disease with (acute) exacerbation: Secondary | ICD-10-CM | POA: Diagnosis not present

## 2017-06-18 DIAGNOSIS — I110 Hypertensive heart disease with heart failure: Secondary | ICD-10-CM | POA: Diagnosis not present

## 2017-06-18 DIAGNOSIS — J9620 Acute and chronic respiratory failure, unspecified whether with hypoxia or hypercapnia: Secondary | ICD-10-CM | POA: Diagnosis not present

## 2017-06-18 DIAGNOSIS — I2510 Atherosclerotic heart disease of native coronary artery without angina pectoris: Secondary | ICD-10-CM | POA: Diagnosis not present

## 2017-06-18 DIAGNOSIS — I5042 Chronic combined systolic (congestive) and diastolic (congestive) heart failure: Secondary | ICD-10-CM | POA: Diagnosis not present

## 2017-06-29 ENCOUNTER — Emergency Department
Admission: EM | Admit: 2017-06-29 | Discharge: 2017-06-29 | Disposition: A | Discharge status: Home / Self Care | Payer: Medicare Other | Attending: Emergency Medicine | Admitting: Emergency Medicine

## 2017-06-29 DIAGNOSIS — Z79899 Other long term (current) drug therapy: Secondary | ICD-10-CM | POA: Diagnosis not present

## 2017-06-29 DIAGNOSIS — I2510 Atherosclerotic heart disease of native coronary artery without angina pectoris: Secondary | ICD-10-CM | POA: Diagnosis not present

## 2017-06-29 DIAGNOSIS — F17210 Nicotine dependence, cigarettes, uncomplicated: Secondary | ICD-10-CM | POA: Insufficient documentation

## 2017-06-29 DIAGNOSIS — Z7982 Long term (current) use of aspirin: Secondary | ICD-10-CM | POA: Diagnosis not present

## 2017-06-29 DIAGNOSIS — I119 Hypertensive heart disease without heart failure: Secondary | ICD-10-CM | POA: Diagnosis not present

## 2017-06-29 DIAGNOSIS — M545 Low back pain: Secondary | ICD-10-CM | POA: Diagnosis not present

## 2017-06-29 DIAGNOSIS — G8929 Other chronic pain: Secondary | ICD-10-CM

## 2017-06-29 DIAGNOSIS — M549 Dorsalgia, unspecified: Secondary | ICD-10-CM | POA: Diagnosis not present

## 2017-06-29 DIAGNOSIS — N390 Urinary tract infection, site not specified: Secondary | ICD-10-CM

## 2017-06-29 DIAGNOSIS — J449 Chronic obstructive pulmonary disease, unspecified: Secondary | ICD-10-CM | POA: Diagnosis not present

## 2017-06-29 LAB — COMPREHENSIVE METABOLIC PANEL
ALT: 10 U/L — ABNORMAL LOW (ref 14–54)
ANION GAP: 10 (ref 5–15)
AST: 19 U/L (ref 15–41)
Albumin: 3.6 g/dL (ref 3.5–5.0)
Alkaline Phosphatase: 49 U/L (ref 38–126)
BILIRUBIN TOTAL: 0.5 mg/dL (ref 0.3–1.2)
BUN: 27 mg/dL — AB (ref 6–20)
CHLORIDE: 106 mmol/L (ref 101–111)
CO2: 26 mmol/L (ref 22–32)
Calcium: 8.8 mg/dL — ABNORMAL LOW (ref 8.9–10.3)
Creatinine, Ser: 1.11 mg/dL — ABNORMAL HIGH (ref 0.44–1.00)
GFR, EST AFRICAN AMERICAN: 53 mL/min — AB (ref >60)
GFR, EST NON AFRICAN AMERICAN: 46 mL/min — AB (ref >60)
Glucose, Bld: 142 mg/dL — ABNORMAL HIGH (ref 65–99)
Potassium: 3.6 mmol/L (ref 3.5–5.1)
Sodium: 142 mmol/L (ref 135–145)
TOTAL PROTEIN: 7.4 g/dL (ref 6.5–8.1)

## 2017-06-29 LAB — URINALYSIS, COMPLETE (UACMP) WITH MICROSCOPIC
BILIRUBIN URINE: NEGATIVE
GLUCOSE, UA: NEGATIVE mg/dL
KETONES UR: NEGATIVE mg/dL
LEUKOCYTES UA: NEGATIVE
NITRITE: POSITIVE — AB
PH: 5 (ref 5.0–8.0)
PROTEIN: 100 mg/dL — AB
Specific Gravity, Urine: 1.018 (ref 1.005–1.030)

## 2017-06-29 LAB — CBC
HEMATOCRIT: 41.7 % (ref 35.0–47.0)
Hemoglobin: 13.8 g/dL (ref 12.0–16.0)
MCH: 30.6 pg (ref 26.0–34.0)
MCHC: 33.2 g/dL (ref 32.0–36.0)
MCV: 92.3 fL (ref 80.0–100.0)
PLATELETS: 170 10*3/uL (ref 150–440)
RBC: 4.52 MIL/uL (ref 3.80–5.20)
RDW: 16.5 % — AB (ref 11.5–14.5)
WBC: 12.2 10*3/uL — AB (ref 3.6–11.0)

## 2017-06-29 MED ORDER — CEFTRIAXONE SODIUM IN DEXTROSE 20 MG/ML IV SOLN
1.0000 g | Freq: Once | INTRAVENOUS | Status: AC
  Administered 2017-06-29: 1 g via INTRAVENOUS
  Filled 2017-06-29: qty 50

## 2017-06-29 MED ORDER — HYDROMORPHONE HCL 1 MG/ML IJ SOLN
INTRAMUSCULAR | Status: AC
  Filled 2017-06-29: qty 1

## 2017-06-29 MED ORDER — HYDROMORPHONE HCL 1 MG/ML IJ SOLN
1.0000 mg | Freq: Once | INTRAMUSCULAR | Status: AC
  Administered 2017-06-29: 1 mg via INTRAVENOUS
  Filled 2017-06-29: qty 1

## 2017-06-29 MED ORDER — CEPHALEXIN 500 MG PO CAPS: 500.0000 mg | ORAL_CAPSULE | Freq: Two times a day (BID) | ORAL | 0 refills | Status: DC

## 2017-06-29 MED ORDER — PREDNISONE 20 MG PO TABS: 40.0000 mg | ORAL_TABLET | Freq: Every day | ORAL | 0 refills | Status: DC

## 2017-06-29 MED ORDER — HYDROMORPHONE HCL 1 MG/ML IJ SOLN
1.0000 mg | INTRAMUSCULAR | Status: AC
  Administered 2017-06-29: 1 mg via INTRAMUSCULAR

## 2017-06-29 MED ORDER — OXYCODONE-ACETAMINOPHEN 5-325 MG PO TABS: 1.0000 | ORAL_TABLET | Freq: Four times a day (QID) | ORAL | 0 refills | Status: DC | PRN
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
## 2017-06-29 NOTE — ED Notes (Addendum)
2 attempts with [place] IV by this RN, right AC and forearm

## 2017-06-29 NOTE — ED Notes (Signed)
In and out cath performed by Maralyn Sago NT, witnessed by [employer]

## 2017-06-29 NOTE — ED Notes (Signed)
MD at bedside attempting US IV

## 2017-06-29 NOTE — ED Triage Notes (Signed)
Patient c/o mid back pain radiating down legs. Patient has hx of COPD and CHF. Patient wears 2L [place] prn.

## 2017-06-29 NOTE — ED Notes (Signed)
Danelle Earthly, RN at bedside to attempt ultrasound IV insertion

## 2017-06-30 LAB — URINE CULTURE

## 2017-07-04 DIAGNOSIS — I2510 Atherosclerotic heart disease of native coronary artery without angina pectoris: Secondary | ICD-10-CM | POA: Diagnosis not present

## 2017-07-04 DIAGNOSIS — I5042 Chronic combined systolic (congestive) and diastolic (congestive) heart failure: Secondary | ICD-10-CM | POA: Diagnosis not present

## 2017-07-04 DIAGNOSIS — N390 Urinary tract infection, site not specified: Secondary | ICD-10-CM | POA: Diagnosis not present

## 2017-07-04 DIAGNOSIS — J441 Chronic obstructive pulmonary disease with (acute) exacerbation: Secondary | ICD-10-CM | POA: Diagnosis not present

## 2017-07-04 DIAGNOSIS — J9620 Acute and chronic respiratory failure, unspecified whether with hypoxia or hypercapnia: Secondary | ICD-10-CM | POA: Diagnosis not present

## 2017-07-04 DIAGNOSIS — I110 Hypertensive heart disease with heart failure: Secondary | ICD-10-CM | POA: Diagnosis not present

## 2017-07-08 DIAGNOSIS — I110 Hypertensive heart disease with heart failure: Secondary | ICD-10-CM | POA: Diagnosis not present

## 2017-07-08 DIAGNOSIS — J449 Chronic obstructive pulmonary disease, unspecified: Secondary | ICD-10-CM | POA: Diagnosis not present

## 2017-07-08 DIAGNOSIS — I2510 Atherosclerotic heart disease of native coronary artery without angina pectoris: Secondary | ICD-10-CM | POA: Diagnosis not present

## 2017-07-08 DIAGNOSIS — Z87891 Personal history of nicotine dependence: Secondary | ICD-10-CM | POA: Diagnosis not present

## 2017-07-08 DIAGNOSIS — Z7982 Long term (current) use of aspirin: Secondary | ICD-10-CM | POA: Diagnosis not present

## 2017-07-08 DIAGNOSIS — N390 Urinary tract infection, site not specified: Secondary | ICD-10-CM | POA: Diagnosis not present

## 2017-07-08 DIAGNOSIS — J9620 Acute and chronic respiratory failure, unspecified whether with hypoxia or hypercapnia: Secondary | ICD-10-CM | POA: Diagnosis not present

## 2017-07-08 DIAGNOSIS — Z87440 Personal history of urinary (tract) infections: Secondary | ICD-10-CM | POA: Diagnosis not present

## 2017-07-08 DIAGNOSIS — I5042 Chronic combined systolic (congestive) and diastolic (congestive) heart failure: Secondary | ICD-10-CM | POA: Diagnosis not present

## 2017-07-08 DIAGNOSIS — Z87828 Personal history of other (healed) physical injury and trauma: Secondary | ICD-10-CM | POA: Diagnosis not present

## 2017-07-08 DIAGNOSIS — M1990 Unspecified osteoarthritis, unspecified site: Secondary | ICD-10-CM | POA: Diagnosis not present

## 2017-07-11 DIAGNOSIS — M5441 Lumbago with sciatica, right side: Secondary | ICD-10-CM | POA: Diagnosis not present

## 2017-07-11 DIAGNOSIS — G8929 Other chronic pain: Secondary | ICD-10-CM | POA: Diagnosis not present

## 2017-07-11 DIAGNOSIS — M461 Sacroiliitis, not elsewhere classified: Secondary | ICD-10-CM | POA: Diagnosis not present

## 2017-07-11 DIAGNOSIS — R7989 Other specified abnormal findings of blood chemistry: Secondary | ICD-10-CM | POA: Diagnosis not present

## 2017-07-11 DIAGNOSIS — D72829 Elevated white blood cell count, unspecified: Secondary | ICD-10-CM | POA: Diagnosis not present

## 2017-07-11 DIAGNOSIS — M5442 Lumbago with sciatica, left side: Secondary | ICD-10-CM | POA: Diagnosis not present

## 2017-07-11 DIAGNOSIS — N3000 Acute cystitis without hematuria: Secondary | ICD-10-CM | POA: Diagnosis not present

## 2017-07-14 DIAGNOSIS — R809 Proteinuria, unspecified: Secondary | ICD-10-CM | POA: Diagnosis not present

## 2017-07-14 DIAGNOSIS — R609 Edema, unspecified: Secondary | ICD-10-CM | POA: Diagnosis not present

## 2017-07-14 DIAGNOSIS — I10 Essential (primary) hypertension: Secondary | ICD-10-CM | POA: Diagnosis not present

## 2017-07-14 DIAGNOSIS — N183 Chronic kidney disease, stage 3 (moderate): Secondary | ICD-10-CM | POA: Diagnosis not present

## 2017-07-15 DIAGNOSIS — J449 Chronic obstructive pulmonary disease, unspecified: Secondary | ICD-10-CM | POA: Diagnosis not present

## 2017-07-15 DIAGNOSIS — I2510 Atherosclerotic heart disease of native coronary artery without angina pectoris: Secondary | ICD-10-CM | POA: Diagnosis not present

## 2017-07-15 DIAGNOSIS — I110 Hypertensive heart disease with heart failure: Secondary | ICD-10-CM | POA: Diagnosis not present

## 2017-07-15 DIAGNOSIS — N390 Urinary tract infection, site not specified: Secondary | ICD-10-CM | POA: Diagnosis not present

## 2017-07-15 DIAGNOSIS — I5042 Chronic combined systolic (congestive) and diastolic (congestive) heart failure: Secondary | ICD-10-CM | POA: Diagnosis not present

## 2017-07-15 DIAGNOSIS — J9620 Acute and chronic respiratory failure, unspecified whether with hypoxia or hypercapnia: Secondary | ICD-10-CM | POA: Diagnosis not present

## 2017-07-23 DIAGNOSIS — M25512 Pain in left shoulder: Secondary | ICD-10-CM | POA: Diagnosis not present

## 2017-07-23 DIAGNOSIS — E119 Type 2 diabetes mellitus without complications: Secondary | ICD-10-CM | POA: Diagnosis not present

## 2017-07-23 DIAGNOSIS — M19012 Primary osteoarthritis, left shoulder: Secondary | ICD-10-CM | POA: Diagnosis not present

## 2017-07-24 ENCOUNTER — Encounter: Payer: Self-pay | Admitting: Specialist

## 2017-07-24 ENCOUNTER — Inpatient Hospital Stay
Admission: EM | Admit: 2017-07-24 | Discharge: 2017-07-29 | DRG: 549 | Disposition: A | Discharge status: Skilled Nursing Facility | Payer: Medicare Other | Attending: Internal Medicine | Admitting: Internal Medicine

## 2017-07-24 ENCOUNTER — Other Ambulatory Visit: Payer: Self-pay

## 2017-07-24 ENCOUNTER — Emergency Department: Payer: Medicare Other

## 2017-07-24 DIAGNOSIS — Z7982 Long term (current) use of aspirin: Secondary | ICD-10-CM

## 2017-07-24 DIAGNOSIS — I110 Hypertensive heart disease with heart failure: Secondary | ICD-10-CM | POA: Diagnosis not present

## 2017-07-24 DIAGNOSIS — Z79899 Other long term (current) drug therapy: Secondary | ICD-10-CM

## 2017-07-24 DIAGNOSIS — M19012 Primary osteoarthritis, left shoulder: Secondary | ICD-10-CM | POA: Diagnosis not present

## 2017-07-24 DIAGNOSIS — W19XXXA Unspecified fall, initial encounter: Secondary | ICD-10-CM | POA: Diagnosis not present

## 2017-07-24 DIAGNOSIS — Z96659 Presence of unspecified artificial knee joint: Secondary | ICD-10-CM | POA: Diagnosis present

## 2017-07-24 DIAGNOSIS — Z23 Encounter for immunization: Secondary | ICD-10-CM | POA: Diagnosis not present

## 2017-07-24 DIAGNOSIS — R531 Weakness: Secondary | ICD-10-CM

## 2017-07-24 DIAGNOSIS — B9620 Unspecified Escherichia coli [E. coli] as the cause of diseases classified elsewhere: Secondary | ICD-10-CM | POA: Diagnosis not present

## 2017-07-24 DIAGNOSIS — J449 Chronic obstructive pulmonary disease, unspecified: Secondary | ICD-10-CM | POA: Diagnosis present

## 2017-07-24 DIAGNOSIS — M009 Pyogenic arthritis, unspecified: Principal | ICD-10-CM | POA: Diagnosis present

## 2017-07-24 DIAGNOSIS — Z888 Allergy status to other drugs, medicaments and biological substances status: Secondary | ICD-10-CM

## 2017-07-24 DIAGNOSIS — L02414 Cutaneous abscess of left upper limb: Secondary | ICD-10-CM | POA: Diagnosis not present

## 2017-07-24 DIAGNOSIS — R296 Repeated falls: Secondary | ICD-10-CM | POA: Diagnosis present

## 2017-07-24 DIAGNOSIS — M779 Enthesopathy, unspecified: Secondary | ICD-10-CM | POA: Diagnosis present

## 2017-07-24 DIAGNOSIS — Z9981 Dependence on supplemental oxygen: Secondary | ICD-10-CM | POA: Diagnosis not present

## 2017-07-24 DIAGNOSIS — M1712 Unilateral primary osteoarthritis, left knee: Secondary | ICD-10-CM | POA: Diagnosis not present

## 2017-07-24 DIAGNOSIS — A419 Sepsis, unspecified organism: Secondary | ICD-10-CM

## 2017-07-24 DIAGNOSIS — B958 Unspecified staphylococcus as the cause of diseases classified elsewhere: Secondary | ICD-10-CM | POA: Diagnosis not present

## 2017-07-24 DIAGNOSIS — M75120 Complete rotator cuff tear or rupture of unspecified shoulder, not specified as traumatic: Secondary | ICD-10-CM | POA: Diagnosis not present

## 2017-07-24 DIAGNOSIS — M2540 Effusion, unspecified joint: Secondary | ICD-10-CM | POA: Diagnosis present

## 2017-07-24 DIAGNOSIS — Z882 Allergy status to sulfonamides status: Secondary | ICD-10-CM

## 2017-07-24 DIAGNOSIS — I10 Essential (primary) hypertension: Secondary | ICD-10-CM | POA: Diagnosis not present

## 2017-07-24 DIAGNOSIS — Z7951 Long term (current) use of inhaled steroids: Secondary | ICD-10-CM | POA: Diagnosis not present

## 2017-07-24 DIAGNOSIS — I517 Cardiomegaly: Secondary | ICD-10-CM | POA: Diagnosis not present

## 2017-07-24 DIAGNOSIS — E785 Hyperlipidemia, unspecified: Secondary | ICD-10-CM | POA: Diagnosis present

## 2017-07-24 DIAGNOSIS — I2510 Atherosclerotic heart disease of native coronary artery without angina pectoris: Secondary | ICD-10-CM | POA: Diagnosis not present

## 2017-07-24 DIAGNOSIS — F17210 Nicotine dependence, cigarettes, uncomplicated: Secondary | ICD-10-CM | POA: Diagnosis not present

## 2017-07-24 DIAGNOSIS — N390 Urinary tract infection, site not specified: Secondary | ICD-10-CM | POA: Diagnosis not present

## 2017-07-24 DIAGNOSIS — I5042 Chronic combined systolic (congestive) and diastolic (congestive) heart failure: Secondary | ICD-10-CM | POA: Diagnosis present

## 2017-07-24 DIAGNOSIS — M25512 Pain in left shoulder: Secondary | ICD-10-CM | POA: Diagnosis not present

## 2017-07-24 DIAGNOSIS — Z452 Encounter for adjustment and management of vascular access device: Secondary | ICD-10-CM

## 2017-07-24 DIAGNOSIS — M659 Synovitis and tenosynovitis, unspecified: Secondary | ICD-10-CM | POA: Diagnosis not present

## 2017-07-24 DIAGNOSIS — Z8249 Family history of ischemic heart disease and other diseases of the circulatory system: Secondary | ICD-10-CM

## 2017-07-24 LAB — URINALYSIS, COMPLETE (UACMP) WITH MICROSCOPIC
Bilirubin Urine: NEGATIVE
Glucose, UA: NEGATIVE mg/dL
KETONES UR: NEGATIVE mg/dL
Nitrite: NEGATIVE
PH: 5 (ref 5.0–8.0)
Protein, ur: 100 mg/dL — AB
Specific Gravity, Urine: 1.023 (ref 1.005–1.030)

## 2017-07-24 LAB — CBC
HCT: 33 % — ABNORMAL LOW (ref 35.0–47.0)
HEMOGLOBIN: 10.8 g/dL — AB (ref 12.0–16.0)
MCH: 29.5 pg (ref 26.0–34.0)
MCHC: 32.8 g/dL (ref 32.0–36.0)
MCV: 89.7 fL (ref 80.0–100.0)
PLATELETS: 418 10*3/uL (ref 150–440)
RBC: 3.68 MIL/uL — AB (ref 3.80–5.20)
RDW: 17.1 % — ABNORMAL HIGH (ref 11.5–14.5)
WBC: 16.2 10*3/uL — AB (ref 3.6–11.0)

## 2017-07-24 LAB — BASIC METABOLIC PANEL
ANION GAP: 13 (ref 5–15)
BUN: 30 mg/dL — ABNORMAL HIGH (ref 6–20)
CALCIUM: 8.3 mg/dL — AB (ref 8.9–10.3)
CO2: 28 mmol/L (ref 22–32)
CREATININE: 1.16 mg/dL — AB (ref 0.44–1.00)
Chloride: 98 mmol/L — ABNORMAL LOW (ref 101–111)
GFR, EST AFRICAN AMERICAN: 50 mL/min — AB (ref >60)
GFR, EST NON AFRICAN AMERICAN: 43 mL/min — AB (ref >60)
Glucose, Bld: 140 mg/dL — ABNORMAL HIGH (ref 65–99)
Potassium: 3.8 mmol/L (ref 3.5–5.1)
SODIUM: 139 mmol/L (ref 135–145)

## 2017-07-24 LAB — LACTIC ACID, PLASMA: LACTIC ACID, VENOUS: 1.3 mmol/L (ref 0.5–1.9)

## 2017-07-24 LAB — GLUCOSE, CAPILLARY: GLUCOSE-CAPILLARY: 140 mg/dL — AB (ref 65–99)

## 2017-07-24 MED ORDER — BUDESONIDE 0.5 MG/2ML IN SUSP
0.5000 mg | Freq: Two times a day (BID) | RESPIRATORY_TRACT | Status: DC
  Administered 2017-07-24 – 2017-07-29 (×9): 0.5 mg via RESPIRATORY_TRACT
  Filled 2017-07-24 (×9): qty 2

## 2017-07-24 MED ORDER — INFLUENZA VAC SPLIT HIGH-DOSE 0.5 ML IM SUSY
0.5000 mL | PREFILLED_SYRINGE | INTRAMUSCULAR | Status: AC
  Administered 2017-07-26: 0.5 mL via INTRAMUSCULAR
  Filled 2017-07-24: qty 0.5

## 2017-07-24 MED ORDER — AMLODIPINE BESYLATE 10 MG PO TABS: 10.0000 mg | ORAL_TABLET | Freq: Every day | ORAL | Status: DC

## 2017-07-24 MED ORDER — SODIUM CHLORIDE 0.9 % IV BOLUS (SEPSIS)
1000.0000 mL | Freq: Once | INTRAVENOUS | Status: AC
  Administered 2017-07-24: 1000 mL via INTRAVENOUS

## 2017-07-24 MED ORDER — OXYCODONE-ACETAMINOPHEN 5-325 MG PO TABS
1.0000 | ORAL_TABLET | Freq: Four times a day (QID) | ORAL | Status: DC | PRN
  Administered 2017-07-24 – 2017-07-28 (×7): 1 via ORAL
  Filled 2017-07-24 (×7): qty 1

## 2017-07-24 MED ORDER — ASPIRIN EC 81 MG PO TBEC
81.0000 mg | DELAYED_RELEASE_TABLET | Freq: Every day | ORAL | Status: DC
  Administered 2017-07-25 – 2017-07-29 (×5): 81 mg via ORAL
  Filled 2017-07-24 (×5): qty 1

## 2017-07-24 MED ORDER — AMLODIPINE BESYLATE 10 MG PO TABS
10.0000 mg | ORAL_TABLET | Freq: Every day | ORAL | Status: DC
  Administered 2017-07-24 – 2017-07-25 (×2): 10 mg via ORAL
  Filled 2017-07-24 (×3): qty 1

## 2017-07-24 MED ORDER — IPRATROPIUM-ALBUTEROL 0.5-2.5 (3) MG/3ML IN SOLN: 3.0000 mL | Freq: Four times a day (QID) | RESPIRATORY_TRACT | Status: DC | PRN

## 2017-07-24 MED ORDER — POTASSIUM CHLORIDE CRYS ER 10 MEQ PO TBCR
10.0000 meq | EXTENDED_RELEASE_TABLET | Freq: Every day | ORAL | Status: DC
  Administered 2017-07-25: 09:00:00 10 meq via ORAL
  Filled 2017-07-24: qty 1

## 2017-07-24 MED ORDER — ONDANSETRON HCL 4 MG PO TABS: 4.0000 mg | ORAL_TABLET | Freq: Four times a day (QID) | ORAL | Status: DC | PRN

## 2017-07-24 MED ORDER — ACETAMINOPHEN 325 MG PO TABS: 650.0000 mg | ORAL_TABLET | Freq: Four times a day (QID) | ORAL | Status: DC | PRN

## 2017-07-24 MED ORDER — METOPROLOL SUCCINATE ER 50 MG PO TB24
50.0000 mg | ORAL_TABLET | Freq: Two times a day (BID) | ORAL | Status: DC
  Administered 2017-07-24 – 2017-07-29 (×8): 50 mg via ORAL
  Filled 2017-07-24 (×9): qty 1

## 2017-07-24 MED ORDER — TIOTROPIUM BROMIDE MONOHYDRATE 18 MCG IN CAPS
18.0000 ug | ORAL_CAPSULE | Freq: Every day | RESPIRATORY_TRACT | Status: DC
  Administered 2017-07-25 – 2017-07-29 (×5): 18 ug via RESPIRATORY_TRACT
  Filled 2017-07-24 (×2): qty 5

## 2017-07-24 MED ORDER — ONDANSETRON HCL 4 MG/2ML IJ SOLN: 4.0000 mg | Freq: Four times a day (QID) | INTRAMUSCULAR | Status: DC | PRN

## 2017-07-24 MED ORDER — ENOXAPARIN SODIUM 40 MG/0.4ML ~~LOC~~ SOLN
40.0000 mg | SUBCUTANEOUS | Status: DC
  Administered 2017-07-24 – 2017-07-28 (×4): 40 mg via SUBCUTANEOUS
  Filled 2017-07-24 (×4): qty 0.4

## 2017-07-24 MED ORDER — FUROSEMIDE 20 MG PO TABS
20.0000 mg | ORAL_TABLET | Freq: Every day | ORAL | Status: DC
  Administered 2017-07-24 – 2017-07-29 (×6): 20 mg via ORAL
  Filled 2017-07-24 (×6): qty 1

## 2017-07-24 MED ORDER — ACETAMINOPHEN 650 MG RE SUPP: 650.0000 mg | Freq: Four times a day (QID) | RECTAL | Status: DC | PRN

## 2017-07-24 MED ORDER — DEXTROSE 5 % IV SOLN
1.0000 g | Freq: Every day | INTRAVENOUS | Status: DC
  Administered 2017-07-26: 1 g via INTRAVENOUS
  Filled 2017-07-24 (×3): qty 10

## 2017-07-24 MED ORDER — CEFTRIAXONE SODIUM IN DEXTROSE 20 MG/ML IV SOLN
1.0000 g | Freq: Once | INTRAVENOUS | Status: AC
  Administered 2017-07-24: 1 g via INTRAVENOUS
  Filled 2017-07-24: qty 50

## 2017-07-24 NOTE — ED Notes (Signed)
In and out cathed for sl cloudy yellow urine.

## 2017-07-24 NOTE — ED Notes (Signed)
Attempting and IV without success.  Patient has severe scar tissue to bilateral arms.  IV team consult order being placed per MD verbal order.

## 2017-07-24 NOTE — ED Notes (Signed)
IV team at bedside 
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
## 2017-07-24 NOTE — Progress Notes (Signed)
Pt is on room air in no distress with O2 saturation of 92%. Pt states that she doesn't wear O2 at home unless she is SOB. O2 nasal cannula is connected and at bedside should pt need O2.

## 2017-07-24 NOTE — ED Triage Notes (Signed)
Pt arrives vis [facility] for weaknesspt is from [facility] indepent. Pt is congested.
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
## 2017-07-25 ENCOUNTER — Encounter: Payer: Self-pay | Admitting: Anesthesiology

## 2017-07-25 ENCOUNTER — Encounter
Admission: EM | Disposition: A | Discharge status: Skilled Nursing Facility | Payer: Self-pay | Source: Home / Self Care | Attending: Internal Medicine

## 2017-07-25 ENCOUNTER — Inpatient Hospital Stay: Payer: Medicare Other

## 2017-07-25 ENCOUNTER — Inpatient Hospital Stay: Payer: Medicare Other | Admitting: Registered Nurse

## 2017-07-25 DIAGNOSIS — N390 Urinary tract infection, site not specified: Secondary | ICD-10-CM | POA: Diagnosis not present

## 2017-07-25 DIAGNOSIS — I110 Hypertensive heart disease with heart failure: Secondary | ICD-10-CM | POA: Diagnosis not present

## 2017-07-25 DIAGNOSIS — L0889 Other specified local infections of the skin and subcutaneous tissue: Secondary | ICD-10-CM | POA: Diagnosis not present

## 2017-07-25 DIAGNOSIS — R296 Repeated falls: Secondary | ICD-10-CM | POA: Diagnosis not present

## 2017-07-25 DIAGNOSIS — M009 Pyogenic arthritis, unspecified: Secondary | ICD-10-CM | POA: Diagnosis not present

## 2017-07-25 DIAGNOSIS — L02414 Cutaneous abscess of left upper limb: Secondary | ICD-10-CM | POA: Diagnosis not present

## 2017-07-25 DIAGNOSIS — S4992XA Unspecified injury of left shoulder and upper arm, initial encounter: Secondary | ICD-10-CM | POA: Diagnosis not present

## 2017-07-25 DIAGNOSIS — I2510 Atherosclerotic heart disease of native coronary artery without angina pectoris: Secondary | ICD-10-CM | POA: Diagnosis not present

## 2017-07-25 DIAGNOSIS — I509 Heart failure, unspecified: Secondary | ICD-10-CM | POA: Diagnosis not present

## 2017-07-25 DIAGNOSIS — F17210 Nicotine dependence, cigarettes, uncomplicated: Secondary | ICD-10-CM | POA: Diagnosis not present

## 2017-07-25 DIAGNOSIS — M19012 Primary osteoarthritis, left shoulder: Secondary | ICD-10-CM | POA: Diagnosis not present

## 2017-07-25 DIAGNOSIS — M79602 Pain in left arm: Secondary | ICD-10-CM | POA: Diagnosis not present

## 2017-07-25 DIAGNOSIS — J449 Chronic obstructive pulmonary disease, unspecified: Secondary | ICD-10-CM | POA: Diagnosis not present

## 2017-07-25 DIAGNOSIS — I5042 Chronic combined systolic (congestive) and diastolic (congestive) heart failure: Secondary | ICD-10-CM | POA: Diagnosis not present

## 2017-07-25 DIAGNOSIS — M1990 Unspecified osteoarthritis, unspecified site: Secondary | ICD-10-CM | POA: Diagnosis not present

## 2017-07-25 DIAGNOSIS — Z23 Encounter for immunization: Secondary | ICD-10-CM | POA: Diagnosis not present

## 2017-07-25 DIAGNOSIS — M00112 Pneumococcal arthritis, left shoulder: Secondary | ICD-10-CM | POA: Diagnosis not present

## 2017-07-25 DIAGNOSIS — I10 Essential (primary) hypertension: Secondary | ICD-10-CM | POA: Diagnosis not present

## 2017-07-25 HISTORY — PX: I & D EXTREMITY: SHX5045

## 2017-07-25 LAB — CBC
HEMATOCRIT: 31.1 % — AB (ref 35.0–47.0)
HEMOGLOBIN: 9.8 g/dL — AB (ref 12.0–16.0)
MCH: 28.4 pg (ref 26.0–34.0)
MCHC: 31.5 g/dL — AB (ref 32.0–36.0)
MCV: 89.9 fL (ref 80.0–100.0)
Platelets: 433 10*3/uL (ref 150–440)
RBC: 3.46 MIL/uL — AB (ref 3.80–5.20)
RDW: 16.6 % — ABNORMAL HIGH (ref 11.5–14.5)
WBC: 15.2 10*3/uL — AB (ref 3.6–11.0)

## 2017-07-25 LAB — SYNOVIAL CELL COUNT + DIFF, W/ CRYSTALS
CRYSTALS FLUID: NONE SEEN
LYMPHOCYTES-SYNOVIAL FLD: 19 %
Neutrophil, Synovial: 81 %
WBC, Synovial: 973 /mm3 — ABNORMAL HIGH (ref 0–200)

## 2017-07-25 LAB — BASIC METABOLIC PANEL
Anion gap: 12 (ref 5–15)
BUN: 32 mg/dL — AB (ref 6–20)
CHLORIDE: 103 mmol/L (ref 101–111)
CO2: 26 mmol/L (ref 22–32)
CREATININE: 1.01 mg/dL — AB (ref 0.44–1.00)
Calcium: 8.3 mg/dL — ABNORMAL LOW (ref 8.9–10.3)
GFR calc non Af Amer: 51 mL/min — ABNORMAL LOW (ref >60)
GFR, EST AFRICAN AMERICAN: 59 mL/min — AB (ref >60)
Glucose, Bld: 125 mg/dL — ABNORMAL HIGH (ref 65–99)
POTASSIUM: 5.1 mmol/L (ref 3.5–5.1)
SODIUM: 141 mmol/L (ref 135–145)

## 2017-07-25 SURGERY — IRRIGATION AND DEBRIDEMENT EXTREMITY
Anesthesia: General | Site: Shoulder | Laterality: Left | Wound class: Dirty or Infected

## 2017-07-25 SURGERY — IRRIGATION AND DEBRIDEMENT EXTREMITY
Anesthesia: Choice | Laterality: Left

## 2017-07-25 MED ORDER — LIDOCAINE HCL (PF) 1 % IJ SOLN
INTRAMUSCULAR | Status: AC
  Filled 2017-07-25: qty 30

## 2017-07-25 MED ORDER — MENTHOL 3 MG MT LOZG
1.0000 | LOZENGE | OROMUCOSAL | Status: DC | PRN
  Filled 2017-07-25: qty 9

## 2017-07-25 MED ORDER — NEOMYCIN-POLYMYXIN B GU 40-200000 IR SOLN
Status: AC
  Filled 2017-07-25: qty 20

## 2017-07-25 MED ORDER — BUPIVACAINE HCL (PF) 0.25 % IJ SOLN
INTRAMUSCULAR | Status: AC
  Filled 2017-07-25: qty 10

## 2017-07-25 MED ORDER — ONDANSETRON HCL 4 MG/2ML IJ SOLN
4.0000 mg | Freq: Once | INTRAMUSCULAR | Status: DC | PRN
Start: 2017-07-25 — End: 2017-07-25

## 2017-07-25 MED ORDER — METOCLOPRAMIDE HCL 5 MG/ML IJ SOLN: 5.0000 mg | Freq: Three times a day (TID) | INTRAMUSCULAR | Status: DC | PRN

## 2017-07-25 MED ORDER — NEOMYCIN-POLYMYXIN B GU 40-200000 IR SOLN
Status: DC | PRN
  Administered 2017-07-25 (×2): 12 mL

## 2017-07-25 MED ORDER — ACETAMINOPHEN 10 MG/ML IV SOLN
INTRAVENOUS | Status: AC
  Filled 2017-07-25: qty 100

## 2017-07-25 MED ORDER — PHENOL 1.4 % MT LIQD
1.0000 | OROMUCOSAL | Status: DC | PRN
  Filled 2017-07-25: qty 177

## 2017-07-25 MED ORDER — PROPOFOL 10 MG/ML IV BOLUS
INTRAVENOUS | Status: AC
  Filled 2017-07-25: qty 20

## 2017-07-25 MED ORDER — LIDOCAINE HCL (CARDIAC) 20 MG/ML IV SOLN
INTRAVENOUS | Status: DC | PRN
  Administered 2017-07-25: 80 mg via INTRAVENOUS

## 2017-07-25 MED ORDER — GLYCOPYRROLATE 0.2 MG/ML IJ SOLN
INTRAMUSCULAR | Status: AC
  Filled 2017-07-25: qty 2

## 2017-07-25 MED ORDER — DEXAMETHASONE SODIUM PHOSPHATE 10 MG/ML IJ SOLN
INTRAMUSCULAR | Status: DC | PRN
  Administered 2017-07-25: 5 mg via INTRAVENOUS

## 2017-07-25 MED ORDER — FENTANYL CITRATE (PF) 100 MCG/2ML IJ SOLN
INTRAMUSCULAR | Status: DC | PRN
  Administered 2017-07-25: 50 ug via INTRAVENOUS
  Administered 2017-07-25 (×2): 25 ug via INTRAVENOUS
  Administered 2017-07-25: 50 ug via INTRAVENOUS

## 2017-07-25 MED ORDER — OXYCODONE HCL 5 MG PO TABS
10.0000 mg | ORAL_TABLET | ORAL | Status: DC | PRN
  Administered 2017-07-26 – 2017-07-29 (×8): 10 mg via ORAL
  Filled 2017-07-25 (×8): qty 2

## 2017-07-25 MED ORDER — BUPIVACAINE HCL (PF) 0.25 % IJ SOLN
INTRAMUSCULAR | Status: DC | PRN
Start: 2017-07-25 — End: 2017-07-25
  Administered 2017-07-25: 10 mL

## 2017-07-25 MED ORDER — FENTANYL CITRATE (PF) 100 MCG/2ML IJ SOLN
INTRAMUSCULAR | Status: AC
  Filled 2017-07-25: qty 2

## 2017-07-25 MED ORDER — ACETAMINOPHEN 10 MG/ML IV SOLN
INTRAVENOUS | Status: DC | PRN
  Administered 2017-07-25: 1000 mg via INTRAVENOUS

## 2017-07-25 MED ORDER — HYDROMORPHONE HCL 1 MG/ML IJ SOLN: 0.5000 mg | INTRAMUSCULAR | Status: DC | PRN

## 2017-07-25 MED ORDER — LIDOCAINE HCL (PF) 2 % IJ SOLN
INTRAMUSCULAR | Status: AC
  Filled 2017-07-25: qty 10

## 2017-07-25 MED ORDER — PROPOFOL 10 MG/ML IV BOLUS
INTRAVENOUS | Status: DC | PRN
  Administered 2017-07-25: 100 mg via INTRAVENOUS

## 2017-07-25 MED ORDER — ROCURONIUM BROMIDE 50 MG/5ML IV SOLN
INTRAVENOUS | Status: AC
  Filled 2017-07-25: qty 1

## 2017-07-25 MED ORDER — LACTATED RINGERS IV SOLN
INTRAVENOUS | Status: DC | PRN
  Administered 2017-07-25: 19:00:00 via INTRAVENOUS

## 2017-07-25 MED ORDER — NEOSTIGMINE METHYLSULFATE 10 MG/10ML IV SOLN
INTRAVENOUS | Status: DC | PRN
  Administered 2017-07-25: 3 mg via INTRAVENOUS

## 2017-07-25 MED ORDER — FENTANYL CITRATE (PF) 100 MCG/2ML IJ SOLN
25.0000 ug | INTRAMUSCULAR | Status: DC | PRN
  Administered 2017-07-25 (×2): 25 ug via INTRAVENOUS

## 2017-07-25 MED ORDER — MORPHINE SULFATE (PF) 2 MG/ML IV SOLN: 2.0000 mg | INTRAVENOUS | Status: DC | PRN

## 2017-07-25 MED ORDER — LIDOCAINE HCL 1 % IJ SOLN
INTRAMUSCULAR | Status: DC | PRN
  Administered 2017-07-25: 10 mL via INTRADERMAL

## 2017-07-25 MED ORDER — GLYCOPYRROLATE 0.2 MG/ML IJ SOLN
INTRAMUSCULAR | Status: DC | PRN
  Administered 2017-07-25: 0.4 mg via INTRAVENOUS

## 2017-07-25 MED ORDER — ONDANSETRON HCL 4 MG/2ML IJ SOLN
INTRAMUSCULAR | Status: AC
  Filled 2017-07-25: qty 2

## 2017-07-25 MED ORDER — METOCLOPRAMIDE HCL 5 MG PO TABS
5.0000 mg | ORAL_TABLET | Freq: Three times a day (TID) | ORAL | Status: DC | PRN
  Filled 2017-07-25: qty 2

## 2017-07-25 MED ORDER — DEXAMETHASONE SODIUM PHOSPHATE 10 MG/ML IJ SOLN
INTRAMUSCULAR | Status: AC
  Filled 2017-07-25: qty 1

## 2017-07-25 MED ORDER — VANCOMYCIN HCL IN DEXTROSE 1-5 GM/200ML-% IV SOLN
1000.0000 mg | INTRAVENOUS | Status: DC
  Administered 2017-07-26 – 2017-07-28 (×3): 1000 mg via INTRAVENOUS
  Filled 2017-07-25 (×5): qty 200

## 2017-07-25 MED ORDER — NEOSTIGMINE METHYLSULFATE 10 MG/10ML IV SOLN
INTRAVENOUS | Status: AC
  Filled 2017-07-25: qty 1

## 2017-07-25 MED ORDER — ROCURONIUM BROMIDE 100 MG/10ML IV SOLN
INTRAVENOUS | Status: DC | PRN
  Administered 2017-07-25: 20 mg via INTRAVENOUS
  Administered 2017-07-25: 30 mg via INTRAVENOUS

## 2017-07-25 MED ORDER — VANCOMYCIN HCL IN DEXTROSE 1-5 GM/200ML-% IV SOLN
1000.0000 mg | Freq: Once | INTRAVENOUS | Status: AC
  Administered 2017-07-25: 1000 mg via INTRAVENOUS
  Filled 2017-07-25: qty 200

## 2017-07-25 MED ORDER — FENTANYL CITRATE (PF) 100 MCG/2ML IJ SOLN
INTRAMUSCULAR | Status: AC
  Administered 2017-07-25: 25 ug via INTRAVENOUS
  Filled 2017-07-25: qty 2

## 2017-07-25 MED ORDER — OXYCODONE HCL 5 MG PO TABS
5.0000 mg | ORAL_TABLET | ORAL | Status: DC | PRN
  Administered 2017-07-26 – 2017-07-27 (×2): 5 mg via ORAL
  Filled 2017-07-25 (×2): qty 1

## 2017-07-25 MED ORDER — PREDNISONE 50 MG PO TABS
50.0000 mg | ORAL_TABLET | Freq: Every day | ORAL | Status: DC
  Administered 2017-07-26 – 2017-07-28 (×3): 50 mg via ORAL
  Filled 2017-07-25 (×3): qty 1

## 2017-07-25 MED ORDER — ONDANSETRON HCL 4 MG/2ML IJ SOLN
INTRAMUSCULAR | Status: DC | PRN
  Administered 2017-07-25: 4 mg via INTRAVENOUS

## 2017-07-25 SURGICAL SUPPLY — 27 items
CANISTER SUCT 1200ML W/VALVE (MISCELLANEOUS) ×3 IMPLANT
CANISTER SUCT 3000ML (MISCELLANEOUS) ×6 IMPLANT
CHLORAPREP W/TINT 10.5 ML (MISCELLANEOUS) ×6 IMPLANT
DRAPE INCISE IOBAN 66X60 STRL (DRAPES) ×3 IMPLANT
DRAPE U-SHAPE 47X51 STRL (DRAPES) ×3 IMPLANT
DRSG TEGADERM 2-3/8X2-3/4 SM (GAUZE/BANDAGES/DRESSINGS) ×3 IMPLANT
ELECT REM PT RETURN 9FT ADLT (ELECTROSURGICAL) ×3
ELECTRODE REM PT RTRN 9FT ADLT (ELECTROSURGICAL) ×1 IMPLANT
GAUZE PETRO XEROFOAM 1X8 (MISCELLANEOUS) ×3 IMPLANT
GAUZE SPONGE 4X4 12PLY STRL (GAUZE/BANDAGES/DRESSINGS) IMPLANT
GLOVE BIOGEL PI IND STRL 8 (GLOVE) ×10 IMPLANT
GLOVE BIOGEL PI INDICATOR 8 (GLOVE) ×20
GLOVE SURG SYN 7.5  E (GLOVE)
GLOVE SURG SYN 7.5 E (GLOVE) IMPLANT
GOWN STRL REUS W/ TWL LRG LVL3 (GOWN DISPOSABLE) ×3 IMPLANT
GOWN STRL REUS W/ TWL XL LVL3 (GOWN DISPOSABLE) ×1 IMPLANT
GOWN STRL REUS W/TWL LRG LVL3 (GOWN DISPOSABLE) ×6
GOWN STRL REUS W/TWL XL LVL3 (GOWN DISPOSABLE) ×2
KIT RM TURNOVER STRD PROC AR (KITS) ×3 IMPLANT
MAT BLUE FLOOR 46X72 FLO (MISCELLANEOUS) ×6 IMPLANT
NS IRRIG 500ML POUR BTL (IV SOLUTION) ×6 IMPLANT
PACK EXTREMITY ARMC (MISCELLANEOUS) ×3 IMPLANT
PULSAVAC PLUS IRRIG FAN TIP (DISPOSABLE) ×3
SOL .9 NS 3000ML IRR  AL (IV SOLUTION) ×4
SOL .9 NS 3000ML IRR UROMATIC (IV SOLUTION) ×2 IMPLANT
STAPLER PROXIMATE SKIN (STAPLE) ×3 IMPLANT
TIP FAN IRRIG PULSAVAC PLUS (DISPOSABLE) ×1 IMPLANT

## 2017-07-25 SURGICAL SUPPLY — 17 items
CANISTER SUCT 1200ML W/VALVE (MISCELLANEOUS) ×3 IMPLANT
CHLORAPREP W/TINT 10.5 ML (MISCELLANEOUS) ×3 IMPLANT
ELECT REM PT RETURN 9FT ADLT (ELECTROSURGICAL) ×3
ELECTRODE REM PT RTRN 9FT ADLT (ELECTROSURGICAL) ×1 IMPLANT
GAUZE PETRO XEROFOAM 1X8 (MISCELLANEOUS) IMPLANT
GAUZE SPONGE 4X4 12PLY STRL (GAUZE/BANDAGES/DRESSINGS) ×3 IMPLANT
GLOVE BIOGEL PI IND STRL 8 (GLOVE) ×1 IMPLANT
GLOVE BIOGEL PI INDICATOR 8 (GLOVE) ×2
GLOVE SURG SYN 7.5  E (GLOVE) ×2
GLOVE SURG SYN 7.5 E (GLOVE) ×1 IMPLANT
GOWN STRL REUS W/ TWL LRG LVL3 (GOWN DISPOSABLE) ×1 IMPLANT
GOWN STRL REUS W/ TWL XL LVL3 (GOWN DISPOSABLE) ×1 IMPLANT
GOWN STRL REUS W/TWL LRG LVL3 (GOWN DISPOSABLE) ×2
GOWN STRL REUS W/TWL XL LVL3 (GOWN DISPOSABLE) ×2
KIT RM TURNOVER STRD PROC AR (KITS) ×3 IMPLANT
NS IRRIG 500ML POUR BTL (IV SOLUTION) ×3 IMPLANT
PACK EXTREMITY ARMC (MISCELLANEOUS) ×3 IMPLANT

## 2017-07-25 NOTE — Clinical Social Work Note (Signed)
CSW received consult that patient may need SNF placement, awaiting PT recommendations.  Ervin KnackEric R. Theoren Palka, MSW, Theresia MajorsLCSWA [phone]  07/25/2017 6:06 PM
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
## 2017-07-25 NOTE — Progress Notes (Signed)
Patient is now s/p L shoulder open I&D. Significant purulent material noted in subacromial space, subdeltoid space, and glenohumeral joint. Subacromial bursa grossly infected. Multiple culture samples sent.   Postop plan: - Maintain hemovac drain until POD#2 - Continue IV Abx per primary (broad spectrum). May not get Culture diagnosis given Abx were started prior to OR. - Start DVT ppx on POD#1 - NWB on LUE. Maintain sling for comfort. - Follow up Cx results
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
## 2017-07-25 NOTE — Progress Notes (Signed)
Pharmacy Antibiotic Note  Helen CraneGertrude Moore is a 82 y.o. female admitted on 07/24/2017 with UTI and possible septic joint.  Pharmacy has been consulted for vancomycin dosing.  Patient is on day #2 of ceftriaxone for UTI. Due to concern for possible septic joint, vancomycin is being initiated.  Plan: Vancomycin 1000 mg once followed by vancomycin 1000 mg IV q24h (6 hour stacked dose) Goal VT 15-20 mcg/mL VT ordered for 11/11   Kinetics: Adjusted body weight = 65 kg, CrCl = 46 mL/min Ke: 0.045 Half-life: 16 hrs Vd: 45L Cmin ~16 mcg/mL estimated  Height: 5\' 6"  (167.6 cm) Weight: 162 lb 12.8 oz (73.8 kg) IBW/kg (Calculated) : 59.3  Temp (24hrs), Avg:98.9 F (37.2 C), Min:98.7 F (37.1 C), Max:99.1 F (37.3 C)  Recent Labs  Lab 07/24/17 1534 07/24/17 1605 07/25/17 1120  WBC 16.2*  --  15.2*  CREATININE 1.16*  --  1.01*  LATICACIDVEN  --  1.3  --     Estimated Creatinine Clearance: 45.7 mL/min (A) (by C-G formula based on SCr of 1.01 mg/dL (H)).    Allergies  Allergen Reactions  . Motrin [Ibuprofen]   . Sulfur     Antimicrobials this admission: ceftriaxone 11/8 >>  vancomycin 11/9 >>   Dose adjustments this admission:  Microbiology results:  Thank you for allowing pharmacy to be a part of this patient's care.  Cindi CarbonMary M Tiaira Arambula, PharmD, BCPS Clinical Pharmacist 07/25/2017 2:36 PM

## 2017-07-25 NOTE — Progress Notes (Addendum)
OT Cancellation Note  Patient Details Name: Helen CraneGertrude Moore MRN: [id number] DOB: 10/13/1934   Cancelled Treatment:    Reason Eval/Treat Not Completed: Patient at procedure or test/ unavailable(Pt. preparing for an aspiration procedure. Will continue to monitor and intervene at a later time/date.)  Olegario MessierElaine Camani Sesay, MS, OTR/L 07/25/2017, 3:13 PM

## 2017-07-25 NOTE — H&P (Signed)
H&P reviewed. Exam as indicated in prior notes.
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
## 2017-07-25 NOTE — Anesthesia Postprocedure Evaluation (Signed)
Anesthesia Post Note  Patient: Helen CraneGertrude Moore  Procedure(s) Performed: IRRIGATION AND DEBRIDEMENT LEFT SHOULDER (Left Shoulder)  Patient location during evaluation: PACU Anesthesia Type: General Level of consciousness: awake and alert Pain management: pain level controlled Vital Signs Assessment: post-procedure vital signs reviewed and stable Respiratory status: spontaneous breathing, nonlabored ventilation, respiratory function stable and patient connected to nasal cannula oxygen Cardiovascular status: blood pressure returned to baseline and stable Postop Assessment: no apparent nausea or vomiting Anesthetic complications: no     Last Vitals:  Vitals:   07/25/17 2204 07/25/17 2206  BP:  (!) 146/63  Pulse: 79 79  Resp: 13 14  Temp:    SpO2: 96% 98%    Last Pain:  Vitals:   07/25/17 2206  TempSrc:   PainSc: 5                  Lateefa Crosby S
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
## 2017-07-25 NOTE — Anesthesia Post-op Follow-up Note (Signed)
Anesthesia QCDR form completed.        

## 2017-07-25 NOTE — Op Note (Addendum)
Operative Note   SURGERY DATE: 07/25/2017  PRE-OP DIAGNOSIS:  1. Septic arthritis of left shoulder 2. Abscess of left subdeltoid and subacromial space  POST-OP DIAGNOSIS: 1. Septic arthritis of left shoulder 2. Abscess of left subdeltoid and subacromial space 3. Infected left subacromial bursa  PROCEDURES:  1. Open incision and drainage of left glenohumeral joint 2. Open incision and drainage of left deep abscess in subdeltoid space and subacromial space. 3. Open incision and drainage of left infected subacromial bursa  SURGEON: Rosealee AlbeeSunny H. Izayah Miner, MD  ASSISTANTS: none  ANESTHESIA: Gen   ESTIMATED BLOOD LOSS:20cc  TOTAL IV FLUIDS: per anesthesia  DRAIN: Hemovac x 1  INDICATION(S):  Helen Moore is a 82 y.o. female with acute onset of left shoulder pain. She had clinical and radiographic findings of septic arthritis of L shoulder. The patient was already on antibiotics prior to the OR due to a UTI. After discussion of risks, benefits, and alternatives to surgery, the patient elected to proceed.   OPERATIVE FINDINGS: significant purulent material of subacromial space, subdeltoid space, and glenohumeral joint as well as infected subacromial bursa; torn and retracted supraspinatus tendon  OPERATIVE REPORT:  I identified Helen Navarroin the pre-operative holding area. Informed consent was obtained and the surgical site was marked. I reviewed the risks and benefits of the proposed surgical intervention and the patient (and/or patient's guardian) wished to proceed. The patient was transferred to the operative suite and general anesthesia was administered. The patient was placed in the beach chair position with the head of the bed elevated approximately 45 degrees. All down side pressure points were appropriately padded. The extremity was then prepped and draped in standard fashion. A time out was performed confirming the correct extremity, correct patient, and correct  procedure.   We used the standard deltopectoral incision from the coracoid to ~10cm distal. We found the cephalic vein and took it laterally, and it was protected throughout the case. We opened the deltopectoral interval widely and placed retractors under the CA ligament in the subacromial space and under the deltoid tendon at its insertion. Immediate purulent material was expressed from both the subacromial and subdeltoid spaces, consistent with abscesses. Culture samples were taken. We then abducted and internally rotated the arm and debrided the subacromial bursa with a rongeur as the bursa was grossly infected. This was also sent for culture sample. Next, we brought the arm back in adduction at slight forward flexion with external rotation. We opened the clavipectoral fascia lateral to the conjoint tendon. The subscapularis was intact. I opened the rotator interval to the glenoid to access the glenohumeral joint. Further purulent material was encountered in the glenohumeral joint. Culture samples were taken. Antibiotics were administered. A flexible tip was used to thoroughly irrigate into the glenohumeral joint through the rotator interval. Additionally, the posterior aspect of the joint was irrigated and debrided from the subacromial space by aiming posteriorly through the supraspinatus tear. The entire wound was then thoroughly irrigated with pulsatile lavage. A medium Hemovac drain was placed exiting out through the deltoid tendon with the drain placed deep to the deltopectoral interval.  One stitch of 0-PDS was placed in the distal aspect of the rotator interval. The proximal rotator interval was left open to let the joint drain. We closed the deltopectoral interval deep to the cephalic vein with a running, 0-PDS suture. I confirmed that the drain was not sewn in after deep closure by pulling on the distal end. The skin was closed with 2-0 PDS and  staples. Xeroform and Honeycomb dressing was applied. A  sling was placed. Patient was extubated, transferred to a stretcher bed and to the post antesthesia care unit in stable condition.   POSTOPERATIVE PLAN: The patient will continue broad spectrum antibiotics. She will remain in a sling for comfort. DVT ppx to start on POD#1. Follow up culture results. NWB on LUE. Drain to stay until at least POD#2.

## 2017-07-25 NOTE — Progress Notes (Signed)
PT Cancellation Note  Patient Details Name: Helen CraneGertrude Moore MRN: [id number] DOB: 1935/01/28   Cancelled Treatment:      Chart reviewed, RN consulted. Holding pt treatment at this time. Pt has ortho consult pending to see if surgery will be needed to address Left shoulder MRI findings.  Will attempt at later date/time.    4:39 PM, 07/25/17 Rosamaria LintsAllan C Jonanthony Nahar, PT, DPT Physical Therapist - [place] [phone] [facility](ASCOM)  [phone] (mobile)   Jaiyah Beining C 07/25/2017, 4:38 PM

## 2017-07-25 NOTE — Anesthesia Procedure Notes (Signed)
Procedure Name: Intubation Date/Time: 07/25/2017 7:05 PM Performed by: Doreen Salvage, CRNA Pre-anesthesia Checklist: Patient identified, Patient being monitored, Timeout performed, Emergency Drugs available and Suction available Patient Re-evaluated:Patient Re-evaluated prior to induction Oxygen Delivery Method: Circle system utilized Preoxygenation: Pre-oxygenation with 100% oxygen Induction Type: IV induction Ventilation: Mask ventilation without difficulty Laryngoscope Size: Mac, 3 and McGraph Grade View: Grade I Tube type: Oral Tube size: 7.0 mm Number of attempts: 1 Airway Equipment and Method: Stylet Placement Confirmation: ETT inserted through vocal cords under direct vision,  positive ETCO2 and breath sounds checked- equal and bilateral Secured at: 21 cm Tube secured with: Tape Dental Injury: Teeth and Oropharynx as per pre-operative assessment

## 2017-07-25 NOTE — OR Nursing (Signed)
Pt ring still located in chart at exit from OR suite and Confirmed to be in chart when Boneta LucksJenny RN PACU received patient report.
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
## 2017-07-26 ENCOUNTER — Encounter: Payer: Self-pay | Admitting: Orthopedic Surgery

## 2017-07-26 DIAGNOSIS — F17210 Nicotine dependence, cigarettes, uncomplicated: Secondary | ICD-10-CM | POA: Diagnosis not present

## 2017-07-26 DIAGNOSIS — Z7951 Long term (current) use of inhaled steroids: Secondary | ICD-10-CM | POA: Diagnosis not present

## 2017-07-26 DIAGNOSIS — L02414 Cutaneous abscess of left upper limb: Secondary | ICD-10-CM | POA: Diagnosis not present

## 2017-07-26 DIAGNOSIS — M009 Pyogenic arthritis, unspecified: Secondary | ICD-10-CM | POA: Diagnosis not present

## 2017-07-26 DIAGNOSIS — I110 Hypertensive heart disease with heart failure: Secondary | ICD-10-CM | POA: Diagnosis not present

## 2017-07-26 DIAGNOSIS — I10 Essential (primary) hypertension: Secondary | ICD-10-CM | POA: Diagnosis not present

## 2017-07-26 DIAGNOSIS — M19012 Primary osteoarthritis, left shoulder: Secondary | ICD-10-CM | POA: Diagnosis not present

## 2017-07-26 DIAGNOSIS — Z7982 Long term (current) use of aspirin: Secondary | ICD-10-CM | POA: Diagnosis not present

## 2017-07-26 DIAGNOSIS — N390 Urinary tract infection, site not specified: Secondary | ICD-10-CM | POA: Diagnosis not present

## 2017-07-26 DIAGNOSIS — E875 Hyperkalemia: Secondary | ICD-10-CM | POA: Diagnosis not present

## 2017-07-26 DIAGNOSIS — B958 Unspecified staphylococcus as the cause of diseases classified elsewhere: Secondary | ICD-10-CM | POA: Diagnosis not present

## 2017-07-26 DIAGNOSIS — M2540 Effusion, unspecified joint: Secondary | ICD-10-CM | POA: Diagnosis not present

## 2017-07-26 DIAGNOSIS — J449 Chronic obstructive pulmonary disease, unspecified: Secondary | ICD-10-CM | POA: Diagnosis not present

## 2017-07-26 DIAGNOSIS — E785 Hyperlipidemia, unspecified: Secondary | ICD-10-CM | POA: Diagnosis not present

## 2017-07-26 DIAGNOSIS — M779 Enthesopathy, unspecified: Secondary | ICD-10-CM | POA: Diagnosis not present

## 2017-07-26 DIAGNOSIS — I2510 Atherosclerotic heart disease of native coronary artery without angina pectoris: Secondary | ICD-10-CM | POA: Diagnosis not present

## 2017-07-26 DIAGNOSIS — R296 Repeated falls: Secondary | ICD-10-CM | POA: Diagnosis not present

## 2017-07-26 DIAGNOSIS — Z79899 Other long term (current) drug therapy: Secondary | ICD-10-CM | POA: Diagnosis not present

## 2017-07-26 DIAGNOSIS — Z9981 Dependence on supplemental oxygen: Secondary | ICD-10-CM | POA: Diagnosis not present

## 2017-07-26 DIAGNOSIS — Z23 Encounter for immunization: Secondary | ICD-10-CM | POA: Diagnosis not present

## 2017-07-26 DIAGNOSIS — M7582 Other shoulder lesions, left shoulder: Secondary | ICD-10-CM | POA: Diagnosis not present

## 2017-07-26 DIAGNOSIS — M75120 Complete rotator cuff tear or rupture of unspecified shoulder, not specified as traumatic: Secondary | ICD-10-CM | POA: Diagnosis not present

## 2017-07-26 DIAGNOSIS — M659 Synovitis and tenosynovitis, unspecified: Secondary | ICD-10-CM | POA: Diagnosis not present

## 2017-07-26 DIAGNOSIS — M1712 Unilateral primary osteoarthritis, left knee: Secondary | ICD-10-CM | POA: Diagnosis not present

## 2017-07-26 DIAGNOSIS — Z96659 Presence of unspecified artificial knee joint: Secondary | ICD-10-CM | POA: Diagnosis not present

## 2017-07-26 DIAGNOSIS — I5042 Chronic combined systolic (congestive) and diastolic (congestive) heart failure: Secondary | ICD-10-CM | POA: Diagnosis not present

## 2017-07-26 DIAGNOSIS — B9620 Unspecified Escherichia coli [E. coli] as the cause of diseases classified elsewhere: Secondary | ICD-10-CM | POA: Diagnosis not present

## 2017-07-26 LAB — POTASSIUM: POTASSIUM: 4.2 mmol/L (ref 3.5–5.1)

## 2017-07-26 LAB — BASIC METABOLIC PANEL
Anion gap: 10 (ref 5–15)
BUN: 33 mg/dL — AB (ref 6–20)
CALCIUM: 8.2 mg/dL — AB (ref 8.9–10.3)
CO2: 26 mmol/L (ref 22–32)
Chloride: 101 mmol/L (ref 101–111)
Creatinine, Ser: 1.12 mg/dL — ABNORMAL HIGH (ref 0.44–1.00)
GFR calc Af Amer: 52 mL/min — ABNORMAL LOW (ref >60)
GFR, EST NON AFRICAN AMERICAN: 45 mL/min — AB (ref >60)
GLUCOSE: 208 mg/dL — AB (ref 65–99)
POTASSIUM: 5.6 mmol/L — AB (ref 3.5–5.1)
SODIUM: 137 mmol/L (ref 135–145)

## 2017-07-26 LAB — CBC
HCT: 34.3 % — ABNORMAL LOW (ref 35.0–47.0)
Hemoglobin: 11.1 g/dL — ABNORMAL LOW (ref 12.0–16.0)
MCH: 29.6 pg (ref 26.0–34.0)
MCHC: 32.3 g/dL (ref 32.0–36.0)
MCV: 91.6 fL (ref 80.0–100.0)
PLATELETS: 445 10*3/uL — AB (ref 150–440)
RBC: 3.74 MIL/uL — AB (ref 3.80–5.20)
RDW: 17.3 % — AB (ref 11.5–14.5)
WBC: 11.8 10*3/uL — AB (ref 3.6–11.0)

## 2017-07-26 LAB — SEDIMENTATION RATE: SED RATE: 108 mm/h — AB (ref 0–30)

## 2017-07-26 LAB — C-REACTIVE PROTEIN: CRP: 27.8 mg/dL — ABNORMAL HIGH (ref <1.0)

## 2017-07-26 MED ORDER — SODIUM POLYSTYRENE SULFONATE 15 GM/60ML PO SUSP
30.0000 g | Freq: Once | ORAL | Status: AC
  Administered 2017-07-26: 30 g via ORAL
  Filled 2017-07-26: qty 120

## 2017-07-26 MED ORDER — POLYETHYLENE GLYCOL 3350 17 G PO PACK
17.0000 g | PACK | Freq: Every day | ORAL | Status: DC
  Administered 2017-07-26 – 2017-07-29 (×3): 17 g via ORAL
  Filled 2017-07-26 (×4): qty 1

## 2017-07-26 MED ORDER — CEPASTAT 14.5 MG MT LOZG
1.0000 | LOZENGE | OROMUCOSAL | Status: DC | PRN
  Filled 2017-07-26: qty 9
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
## 2017-07-26 NOTE — Progress Notes (Addendum)
Spoke to Dr. Amado CoeGouru who asked this nurse to put in order for BMP tomorrow morning; Miralax now then wait to see until 7pm this evening for results. Lab needs to get potassium drawn.
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
## 2017-07-26 NOTE — Progress Notes (Signed)
Report called to Lauren on 1A-no distress noted.

## 2017-07-26 NOTE — Progress Notes (Signed)
Pt BP 108/64. MD Gouru notified. Per MD hold Metoprolol and Amlodipine and still give Lasix.

## 2017-07-27 ENCOUNTER — Inpatient Hospital Stay: Payer: Medicare Other

## 2017-07-27 DIAGNOSIS — I10 Essential (primary) hypertension: Secondary | ICD-10-CM | POA: Diagnosis not present

## 2017-07-27 DIAGNOSIS — F17210 Nicotine dependence, cigarettes, uncomplicated: Secondary | ICD-10-CM | POA: Diagnosis not present

## 2017-07-27 DIAGNOSIS — I5042 Chronic combined systolic (congestive) and diastolic (congestive) heart failure: Secondary | ICD-10-CM | POA: Diagnosis not present

## 2017-07-27 DIAGNOSIS — E875 Hyperkalemia: Secondary | ICD-10-CM | POA: Diagnosis not present

## 2017-07-27 DIAGNOSIS — L02414 Cutaneous abscess of left upper limb: Secondary | ICD-10-CM | POA: Diagnosis not present

## 2017-07-27 DIAGNOSIS — Z452 Encounter for adjustment and management of vascular access device: Secondary | ICD-10-CM | POA: Diagnosis not present

## 2017-07-27 DIAGNOSIS — Z23 Encounter for immunization: Secondary | ICD-10-CM | POA: Diagnosis not present

## 2017-07-27 DIAGNOSIS — M009 Pyogenic arthritis, unspecified: Secondary | ICD-10-CM | POA: Diagnosis not present

## 2017-07-27 DIAGNOSIS — N390 Urinary tract infection, site not specified: Secondary | ICD-10-CM | POA: Diagnosis not present

## 2017-07-27 DIAGNOSIS — M7582 Other shoulder lesions, left shoulder: Secondary | ICD-10-CM | POA: Diagnosis not present

## 2017-07-27 LAB — URINE CULTURE: Culture: >=100000 — AB

## 2017-07-27 LAB — BASIC METABOLIC PANEL
ANION GAP: 10 (ref 5–15)
BUN: 39 mg/dL — ABNORMAL HIGH (ref 6–20)
CALCIUM: 7.6 mg/dL — AB (ref 8.9–10.3)
CO2: 28 mmol/L (ref 22–32)
CREATININE: 1.07 mg/dL — AB (ref 0.44–1.00)
Chloride: 102 mmol/L (ref 101–111)
GFR, EST AFRICAN AMERICAN: 55 mL/min — AB (ref >60)
GFR, EST NON AFRICAN AMERICAN: 48 mL/min — AB (ref >60)
GLUCOSE: 164 mg/dL — AB (ref 65–99)
Potassium: 4.6 mmol/L (ref 3.5–5.1)
Sodium: 140 mmol/L (ref 135–145)

## 2017-07-27 LAB — VANCOMYCIN, TROUGH: Vancomycin Tr: 12 ug/mL — ABNORMAL LOW (ref 15–20)

## 2017-07-27 MED ORDER — MUPIROCIN 2 % EX OINT
1.0000 "application " | TOPICAL_OINTMENT | Freq: Two times a day (BID) | CUTANEOUS | Status: DC
  Administered 2017-07-27 – 2017-07-29 (×5): 1 via NASAL
  Filled 2017-07-27: qty 22

## 2017-07-27 MED ORDER — CHLORHEXIDINE GLUCONATE CLOTH 2 % EX PADS
6.0000 | MEDICATED_PAD | Freq: Every day | CUTANEOUS | Status: DC
  Administered 2017-07-27 – 2017-07-29 (×3): 6 via TOPICAL

## 2017-07-27 MED ORDER — CEPHALEXIN 500 MG PO CAPS
500.0000 mg | ORAL_CAPSULE | Freq: Two times a day (BID) | ORAL | Status: DC
  Administered 2017-07-27 – 2017-07-29 (×5): 500 mg via ORAL
  Filled 2017-07-27 (×6): qty 1
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
## 2017-07-27 NOTE — Care Management Note (Addendum)
Case Management Note  Patient Details  Name: Helen Moore MRN: [id number] Date of Birth: [birth year]  Subjective/Objective:   Discussed discharge planning with Helen Moore. She plans to stay with her sister after this hospital discharge: Helen Moore, [address], [place], [place]: [phone]. PCP=Dr Greggory StallionGeorge. [facility] [place]. Helen Moore currently lives alone but is planning to stay with her sister. Both of them have transportation issues and usually plan their doctor appointments for the same day and both have the same PCP. Helen Moore has Moore RW and Moore BSC at home. She reports that she has 2L PRN 02 from [facility]. She also reports that she has Moore home [facility] RN, PT, and SW from [facility], and this was confirmed by Helen Moore at [facility]. Case management will follow for discharge planning. However, according to [facility], Helen Moore is supposed to be on 1L N/C continuous 02 at home.                  Action/Plan:   Expected Discharge Date:  07/25/17               Expected Discharge Plan:     In-House Referral:     Discharge planning Services     Post Acute Care Choice:    Choice offered to:     DME Arranged:    DME Agency:     HH Arranged:    HH Agency:     Status of Service:     If discussed at [employer] Meetings, dates discussed:    Additional Comments:  Helen Kamel A, RN 07/27/2017, 3:25 PM

## 2017-07-27 NOTE — Progress Notes (Signed)
Pt alert and oriented X4. Educated on need for PICC per MD Fitzgerald's order. [facility] notified. Call back with ETA 1600. Pain medication given to patient before working with PT. No other complaints from pt at this time.

## 2017-07-28 DIAGNOSIS — I5042 Chronic combined systolic (congestive) and diastolic (congestive) heart failure: Secondary | ICD-10-CM | POA: Diagnosis not present

## 2017-07-28 DIAGNOSIS — E875 Hyperkalemia: Secondary | ICD-10-CM | POA: Diagnosis not present

## 2017-07-28 DIAGNOSIS — Z23 Encounter for immunization: Secondary | ICD-10-CM | POA: Diagnosis not present

## 2017-07-28 DIAGNOSIS — L02414 Cutaneous abscess of left upper limb: Secondary | ICD-10-CM | POA: Diagnosis not present

## 2017-07-28 DIAGNOSIS — M7582 Other shoulder lesions, left shoulder: Secondary | ICD-10-CM | POA: Diagnosis not present

## 2017-07-28 DIAGNOSIS — F17210 Nicotine dependence, cigarettes, uncomplicated: Secondary | ICD-10-CM | POA: Diagnosis not present

## 2017-07-28 DIAGNOSIS — M009 Pyogenic arthritis, unspecified: Secondary | ICD-10-CM | POA: Diagnosis not present

## 2017-07-28 DIAGNOSIS — N390 Urinary tract infection, site not specified: Secondary | ICD-10-CM | POA: Diagnosis not present

## 2017-07-28 DIAGNOSIS — I10 Essential (primary) hypertension: Secondary | ICD-10-CM | POA: Diagnosis not present

## 2017-07-28 LAB — BASIC METABOLIC PANEL
Anion gap: 9 (ref 5–15)
BUN: 39 mg/dL — AB (ref 6–20)
CALCIUM: 7.9 mg/dL — AB (ref 8.9–10.3)
CO2: 28 mmol/L (ref 22–32)
CREATININE: 1.06 mg/dL — AB (ref 0.44–1.00)
Chloride: 101 mmol/L (ref 101–111)
GFR calc Af Amer: 56 mL/min — ABNORMAL LOW (ref >60)
GFR, EST NON AFRICAN AMERICAN: 48 mL/min — AB (ref >60)
GLUCOSE: 107 mg/dL — AB (ref 65–99)
Potassium: 4.2 mmol/L (ref 3.5–5.1)
Sodium: 138 mmol/L (ref 135–145)

## 2017-07-28 LAB — CBC
HCT: 29.5 % — ABNORMAL LOW (ref 35.0–47.0)
Hemoglobin: 9.6 g/dL — ABNORMAL LOW (ref 12.0–16.0)
MCH: 28.7 pg (ref 26.0–34.0)
MCHC: 32.4 g/dL (ref 32.0–36.0)
MCV: 88.5 fL (ref 80.0–100.0)
PLATELETS: 477 10*3/uL — AB (ref 150–440)
RBC: 3.33 MIL/uL — ABNORMAL LOW (ref 3.80–5.20)
RDW: 16.4 % — AB (ref 11.5–14.5)
WBC: 14.4 10*3/uL — AB (ref 3.6–11.0)

## 2017-07-28 LAB — BODY FLUID CULTURE: Gram Stain: NONE SEEN

## 2017-07-28 NOTE — Care Management Important Message (Signed)
Important Message  Patient Details  Name: Helen Moore MRN: [id number] Date of Birth: 1935-07-05   Medicare Important Message Given:  Yes    Collie SiadAngela Curly Mackowski, RN 07/28/2017, 7:48 AM
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
## 2017-07-28 NOTE — Progress Notes (Signed)
Infectious Disease Long Term IV Antibiotic Orders Marilynn Ekstein June 12, 1935  Diagnosis: MRSA L shoulder septic joint  Culture results   Specimen Description JOINT FLUID SHOULDER   Special Requests NONE   Gram Stain --   NO WBC SEEN  NO ORGANISMS SEEN    Culture --   RARE METHICILLIN RESISTANT STAPHYLOCOCCUS AUREUS  CRITICAL RESULT CALLED TO, READ BACK BY AND VERIFIED WITH: [facility] AT 1044 07/27/17 BY L BENFIELD  Performed at [facility], [place] [address] [place], [place] [address]    Report Status 07/28/2017 FINAL   Organism ID, Bacteria METHICILLIN RESISTANT STAPHYLOCOCCUS AUREUS  Susceptibility   Methicillin resistant staphylococcus aureus ([id number])   Antibiotic Interpretation Method Status   CIPROFLOXACIN Resistant MIC Final   ERYTHROMYCIN Resistant MIC Final   GENTAMICIN Sensitive MIC Final   OXACILLIN Resistant MIC Final   TETRACYCLINE Sensitive MIC Final   VANCOMYCIN Sensitive MIC Final   TRIMETH/SULFA Sensitive MIC Final   CLINDAMYCIN Sensitive MIC Final   RIFAMPIN Sensitive MIC Final   Inducible Clindamycin Sensitive MIC Final           LABS Lab Results  Component Value Date   CREATININE 1.06 (H) 07/28/2017   Lab Results  Component Value Date   WBC 14.4 (H) 07/28/2017   HGB 9.6 (L) 07/28/2017   HCT 29.5 (L) 07/28/2017   MCV 88.5 07/28/2017   PLT 477 (H) 07/28/2017   Lab Results  Component Value Date   ESRSEDRATE 108 (H) 07/26/2017   Lab Results  Component Value Date   CRP 27.8 (H) 07/26/2017    Allergies:  Allergies  Allergen Reactions  . Motrin [Ibuprofen]   . Sulfur     Discharge antibiotics Vancomycin          1000        mg  every      24         hours .     Goal vancomycin trough 15-20.    Pharmacy to adjust dosing based on levels  PICC Care per protocol Labs weekly while on IV antibiotics -FAX weekly labs to [phone] CBC w diff   Comprehensive met panel Vancomycin Trough   CRP   Planned duration of antibiotics  4 weeks  Stop date  Aug 22 2017  Follow up clinic date 2-3 weeks   Leonel Ramsay, MD
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
## 2017-07-28 NOTE — Clinical Social Work Note (Signed)
Clinical Social Work Assessment  Patient Details  Name: Helen Moore MRN: [id number] Date of Birth: 1935-06-23  Date of referral:  07/28/17               Reason for consult:  Facility Placement                Permission sought to share information with:  Chartered certified accountant granted to share information::  Yes, Verbal Permission Granted  Name::      Helen Moore::   Helen Moore   Relationship::     Contact Information:     Housing/Transportation Living arrangements for the past 2 months:  Helen Moore of Information:  Patient, Other (Comment Required)(Sister Helen Moore. ) Patient Interpreter Needed:  None Criminal Activity/Legal Involvement Pertinent to Current Situation/Hospitalization:  No - Comment as needed Significant Relationships:  Siblings Lives with:  Self Do you feel safe going back to the place where you live?  Yes Need for family participation in patient care:  Yes (Comment)   Care giving concerns:  Patient lives in [place] alone.    Social Worker assessment / plan:  Holiday representative (CSW) received SNF consult. Per [place] assistant patient does not have Medicare Part A according to [facility] it termed on 02/13/14. Per CSW assistant patient does have active Medicare Part B and Medicaid. PT is recommending SNF and patient needs IV ABX. CSW met with patient to discuss D/C plan. CSW explained that in order for patient to be placed into a SNF it will have to be under Medicaid because she does not have Medicare Part A coverage, which pays for room and board at a SNF. CSW explained that under Medicaid patient will have to stay at the SNF for 30 days and sign over her SSI check. Patient refused SNF at first and stated that she is going to her sister Helen Moore's house. Patient then agreed to SNF search. CSW contacted patient's sister Helen Moore who reported that she can't take care of patient and she will have to go to SNF.  CSW made Helen Moore aware of above. FL2 complete and faxed out. CSW will continue to follow and assist as needed.  Employment status:  Disabled (Comment on whether or not currently receiving Disability)(Receives SSI and medicaid. ) Insurance information:  Medicare, Medicaid [place] PT Recommendations:  [place] / Referral to community resources:  [place]  Patient/Family's Response to care:  Patient is considering SNF.   Patient/Family's Understanding of and Emotional Response to Diagnosis, Current Treatment, and Prognosis:  Patient was pleasant and thanked CSW for assistance.   Emotional Assessment Appearance:  Appears stated age Attitude/Demeanor/Rapport:    Affect (typically observed):  Pleasant Orientation:  Oriented to Self, Oriented to Place, Oriented to  Time, Oriented to Situation Alcohol / Substance use:  Not Applicable Psych involvement (Current and /or in the community):  No (Comment)  Discharge Needs  Concerns to be addressed:  Discharge Planning Concerns Readmission within the last 30 days:  No Current discharge risk:  Chronically ill, Dependent with Mobility Barriers to Discharge:  Continued Medical Work up   [employer], Helen Beets, LCSW 07/28/2017, 3:18 PM

## 2017-07-28 NOTE — Care Management (Addendum)
RNCM notified by CSW that patient has declined SNF and plans to go to her sister address: [address]) Call was dropped.  Sister Prudy FeelerOra agrees to bringing patient home.  I have updated Brad with [facility] of home IV antibiotic needs. 1307: RNCM contacted patient's sister again to get address; message left with my callback number. 1435: Address received- [address] [place] [id number]. Patient's sister is stating that patient needs to go to rehab and "they should not ask her/patient without my presence. She knows I cannot take care of her".  CSW updated that sister wants to meet with her tomorrow around 10am.
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
## 2017-07-28 NOTE — Care Management Note (Signed)
Case Management Note  Patient Details  Name: Helen Moore MRN: [id number] Date of Birth: 1934/12/07  Subjective/Objective:                  RNCM met with patient and her sister to discuss discharge planning. Patient agrees to go to SNF for rehab and states she has been told that she will need IV antibiotics which she states she will not be able to do. She is from home alone per patient and dependent on a walker.  She is open to home health nurse [facility].  Action/Plan:   CSW updated.  RNCM will follow.   Expected Discharge Date:  07/25/17               Expected Discharge Plan:     In-House Referral:     Discharge planning Services  CM Consult  Post Acute Care Choice:  Home Health Choice offered to:  Patient, Sibling  DME Arranged:    DME Agency:     HH Arranged:  RN [place] Agency:  [place]  Status of Service:  In process, will continue to follow  If discussed at Long Length of Stay Meetings, dates discussed:    Additional Comments:  Marshell Garfinkel, RN 07/28/2017, 11:19 AM

## 2017-07-28 NOTE — Progress Notes (Signed)
Per [place] admissions coordinator at [facility] they can accept patient under Medicaid and Medicare Part B. Clinical Social Worker (CSW) contacted patient's sister Karin Lieu and made her aware of above. Per [facility] is fine and that is patient's only option because Karin Lieu will not take her home. CSW met with patient and made her aware of above. Patient reported that she would consider going to [facility] once she talks with her sister. CSW made patient aware that [facility] is the only bed offer for Medicaid in [facility] and that her sister stated that she would not be able to care for her. Patient verbalized her understanding. CSW will continue to follow and assist as needed.   [employer], LCSW [phone]
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
## 2017-07-28 NOTE — Progress Notes (Signed)
Pharmacy Antibiotic Note  Helen Moore is a 82 y.o. female admitted on 07/24/2017 with UTI and possible septic joint.  Pharmacy has been consulted for vancomycin dosing.  Patient is also being treated for UTI with cephalexin.   Plan: Vancomyin level = 12 mcg/mL was obtained last night prior to 3rd dose. Unfortunately, stacked initial vancomycin dose was not given on 11/11 and therefore this level does not represent steady state. Will continue current regimen of vancomycin 1000 mg IV q24h and will recheck VT once at steady state. Of note, patient is at risk of accumulation.   Height: 5\' 6"  (167.6 cm) Weight: 162 lb 12.8 oz (73.8 kg) IBW/kg (Calculated) : 59.3  Temp (24hrs), Avg:98.5 F (36.9 C), Min:98.2 F (36.8 C), Max:98.9 F (37.2 C)  Recent Labs  Lab 07/24/17 1534 07/24/17 1605 07/25/17 1120 07/26/17 0721 07/27/17 0317 07/27/17 2150 07/28/17 0647  WBC 16.2*  --  15.2* 11.8*  --   --  14.4*  CREATININE 1.16*  --  1.01* 1.12* 1.07*  --  1.06*  LATICACIDVEN  --  1.3  --   --   --   --   --   VANCOTROUGH  --   --   --   --   --  12*  --     Estimated Creatinine Clearance: 43.5 mL/min (A) (by C-G formula based on SCr of 1.06 mg/dL (H)).    Allergies  Allergen Reactions  . Motrin [Ibuprofen]   . Sulfur     Antimicrobials this admission: ceftriaxone 11/8 >> 11/10 Cephalexin 11/11 >> vancomycin 11/9 >>   Microbiology results: 11/9 Shoulder joint fluid: MRSA 11/8 Urine: E. coli  Thank you for allowing pharmacy to be a part of this patient's care.  Cindi CarbonMary M Blondell Laperle, PharmD, BCPS Clinical Pharmacist 07/28/2017 10:56 AM
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
## 2017-07-28 NOTE — Clinical Social Work Placement (Signed)
   CLINICAL SOCIAL WORK PLACEMENT  NOTE  Date:  07/28/2017  Patient Details  Name: Helen Moore MRN: [id number] Date of Birth: 1935-04-07  Clinical Social Work is seeking post-discharge placement for this patient at the Skilled  Nursing Facility level of care (*CSW will initial, date and re-position this form in  chart as items are completed):  Yes   Patient/family provided with [facility] Clinical Social Work Department's list of facilities offering this level of care within the geographic area requested by the patient (or if unable, by the patient's family).  Yes   Patient/family informed of their freedom to choose among providers that offer the needed level of care, that participate in Medicare, Medicaid or managed care program needed by the patient, have an available bed and are willing to accept the patient.  Yes   Patient/family informed of [facility]'s ownership interest in [facility] and [facility], as well as of the fact that they are under no obligation to receive care at these facilities.  PASRR submitted to EDS on 07/27/17     PASRR number received on 07/27/17     Existing PASRR number confirmed on       FL2 transmitted to all facilities in geographic area requested by pt/family on 07/28/17     FL2 transmitted to all facilities within larger geographic area on       Patient informed that his/her managed care company has contracts with or will negotiate with certain facilities, including the following:            Patient/family informed of bed offers received.  Patient chooses bed at       Physician recommends and patient chooses bed at      Patient to be transferred to   on  .  Patient to be transferred to facility by       Patient family notified on   of transfer.  Name of family member notified:        PHYSICIAN       Additional Comment:    _______________________________________________ Thamar Holik, Darleen CrockerBailey M, LCSW 07/28/2017, 3:16 PM

## 2017-07-29 DIAGNOSIS — Z23 Encounter for immunization: Secondary | ICD-10-CM | POA: Diagnosis not present

## 2017-07-29 DIAGNOSIS — M6281 Muscle weakness (generalized): Secondary | ICD-10-CM | POA: Diagnosis not present

## 2017-07-29 DIAGNOSIS — M009 Pyogenic arthritis, unspecified: Secondary | ICD-10-CM | POA: Diagnosis not present

## 2017-07-29 DIAGNOSIS — N390 Urinary tract infection, site not specified: Secondary | ICD-10-CM | POA: Diagnosis not present

## 2017-07-29 DIAGNOSIS — Z7401 Bed confinement status: Secondary | ICD-10-CM | POA: Diagnosis not present

## 2017-07-29 DIAGNOSIS — I10 Essential (primary) hypertension: Secondary | ICD-10-CM | POA: Diagnosis not present

## 2017-07-29 MED ORDER — PREDNISONE 20 MG PO TABS
40.0000 mg | ORAL_TABLET | Freq: Every day | ORAL | Status: DC
  Administered 2017-07-29: 40 mg via ORAL
  Filled 2017-07-29: qty 2

## 2017-07-29 MED ORDER — OXYCODONE HCL 5 MG PO TABS: 5.0000 mg | ORAL_TABLET | ORAL | 0 refills | Status: DC | PRN

## 2017-07-29 MED ORDER — AMLODIPINE BESYLATE 5 MG PO TABS: 10.0000 mg | ORAL_TABLET | Freq: Every day | ORAL | Status: AC

## 2017-07-29 MED ORDER — PREDNISONE 10 MG PO TABS: 10.0000 mg | ORAL_TABLET | Freq: Every day | ORAL | 0 refills | Status: AC

## 2017-07-29 MED ORDER — VANCOMYCIN HCL IN DEXTROSE 1-5 GM/200ML-% IV SOLN: 1000.0000 mg | INTRAVENOUS | Status: AC

## 2017-07-29 MED ORDER — CEPHALEXIN 500 MG PO CAPS: 500.0000 mg | ORAL_CAPSULE | Freq: Two times a day (BID) | ORAL | 0 refills | Status: AC

## 2017-07-29 NOTE — Progress Notes (Signed)
EMS called for transfer to [facility] and report called to [facility].
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
## 2017-07-29 NOTE — Clinical Social Work Placement (Signed)
   CLINICAL SOCIAL WORK PLACEMENT  NOTE  Date:  07/29/2017  Patient Details  Name: Helen Moore MRN: [id number] Date of Birth: 1935-09-06  Clinical Social Work is seeking post-discharge placement for this patient at the Skilled  Nursing Facility level of care (*CSW will initial, date and re-position this form in  chart as items are completed):  Yes   Patient/family provided with [facility] Clinical Social Work Department's list of facilities offering this level of care within the geographic area requested by the patient (or if unable, by the patient's family).  Yes   Patient/family informed of their freedom to choose among providers that offer the needed level of care, that participate in Medicare, Medicaid or managed care program needed by the patient, have an available bed and are willing to accept the patient.  Yes   Patient/family informed of [facility]'s ownership interest in [facility] and [facility], as well as of the fact that they are under no obligation to receive care at these facilities.  PASRR submitted to EDS on 07/27/17     PASRR number received on 07/27/17     Existing PASRR number confirmed on       FL2 transmitted to all facilities in geographic area requested by pt/family on 07/28/17     FL2 transmitted to all facilities within larger geographic area on       Patient informed that his/her managed care company has contracts with or will negotiate with certain facilities, including the following:        Yes   Patient/family informed of bed offers received.  Patient [facility] bed at [facility] )     Physician recommends and patient chooses bed at      Patient to be transferred to [facility] ) on 07/29/17.  Patient to be transferred to facility by [facility] )     Patient family notified on 07/29/17 of transfer.  Name of family member notified:  (Patient's sister Jason CoopOra Warren is aware of D/C today. )     PHYSICIAN      Additional Comment:    _______________________________________________ Ainslie Mazurek, Darleen CrockerBailey M, LCSW 07/29/2017, 11:09 AM

## 2017-07-29 NOTE — Care Management (Signed)
Helen Moore with [facility] updated on patient discharge to SNF today. No other RNCM needs.

## 2017-07-29 NOTE — Progress Notes (Signed)
RN reported to EMS on their arrival. VSS. Pt in no acute distress. IV removed. Pt discharged [facility] VIA EMS with RUE PICC line intact.

## 2017-07-29 NOTE — Progress Notes (Signed)
Patient is medically stable for D/C to [facility] today. Patient is aware that she will have to stay for 30 days and sign over her SSI check to the facility. Per Olegario MessierKathy liaison for [facility] patient can come today after 12 noon. RN will call report and arrange EMS for transport. Clinical Child psychotherapistocial Worker (CSW) sent D/C orders to [facility]. Patient is aware of above. CSW contacted patient's sister Jason CoopOra Moore and made her aware of above. Please reconsult if future social work needs arise. CSW signing off.   Baker Hughes IncorporatedBailey Jewel Venditto, LCSW [phone]

## 2017-07-31 DIAGNOSIS — N390 Urinary tract infection, site not specified: Secondary | ICD-10-CM | POA: Diagnosis not present

## 2017-07-31 DIAGNOSIS — R531 Weakness: Secondary | ICD-10-CM | POA: Diagnosis not present

## 2017-07-31 DIAGNOSIS — B9562 Methicillin resistant Staphylococcus aureus infection as the cause of diseases classified elsewhere: Secondary | ICD-10-CM | POA: Diagnosis not present

## 2017-07-31 DIAGNOSIS — M542 Cervicalgia: Secondary | ICD-10-CM | POA: Diagnosis not present

## 2017-07-31 DIAGNOSIS — A419 Sepsis, unspecified organism: Secondary | ICD-10-CM | POA: Diagnosis not present

## 2017-07-31 DIAGNOSIS — M546 Pain in thoracic spine: Secondary | ICD-10-CM | POA: Diagnosis not present

## 2017-07-31 DIAGNOSIS — M545 Low back pain: Secondary | ICD-10-CM | POA: Diagnosis not present

## 2017-07-31 DIAGNOSIS — M6281 Muscle weakness (generalized): Secondary | ICD-10-CM | POA: Diagnosis not present

## 2017-07-31 DIAGNOSIS — R109 Unspecified abdominal pain: Secondary | ICD-10-CM | POA: Diagnosis not present

## 2017-07-31 DIAGNOSIS — R262 Difficulty in walking, not elsewhere classified: Secondary | ICD-10-CM | POA: Diagnosis not present

## 2017-08-01 DIAGNOSIS — A419 Sepsis, unspecified organism: Secondary | ICD-10-CM | POA: Diagnosis not present

## 2017-08-01 DIAGNOSIS — N390 Urinary tract infection, site not specified: Secondary | ICD-10-CM | POA: Diagnosis not present

## 2017-08-01 DIAGNOSIS — I10 Essential (primary) hypertension: Secondary | ICD-10-CM | POA: Diagnosis not present

## 2017-08-01 DIAGNOSIS — R531 Weakness: Secondary | ICD-10-CM | POA: Diagnosis not present

## 2017-08-01 DIAGNOSIS — R262 Difficulty in walking, not elsewhere classified: Secondary | ICD-10-CM | POA: Diagnosis not present

## 2017-08-01 DIAGNOSIS — M6281 Muscle weakness (generalized): Secondary | ICD-10-CM | POA: Diagnosis not present

## 2017-08-01 DIAGNOSIS — Z79899 Other long term (current) drug therapy: Secondary | ICD-10-CM | POA: Diagnosis not present

## 2017-08-01 DIAGNOSIS — B9562 Methicillin resistant Staphylococcus aureus infection as the cause of diseases classified elsewhere: Secondary | ICD-10-CM | POA: Diagnosis not present

## 2017-08-02 LAB — AEROBIC/ANAEROBIC CULTURE W GRAM STAIN (SURGICAL/DEEP WOUND)

## 2017-08-02 LAB — AEROBIC/ANAEROBIC CULTURE (SURGICAL/DEEP WOUND)

## 2017-08-03 DIAGNOSIS — R262 Difficulty in walking, not elsewhere classified: Secondary | ICD-10-CM | POA: Diagnosis not present

## 2017-08-03 DIAGNOSIS — N390 Urinary tract infection, site not specified: Secondary | ICD-10-CM | POA: Diagnosis not present

## 2017-08-03 DIAGNOSIS — B9562 Methicillin resistant Staphylococcus aureus infection as the cause of diseases classified elsewhere: Secondary | ICD-10-CM | POA: Diagnosis not present

## 2017-08-03 DIAGNOSIS — R531 Weakness: Secondary | ICD-10-CM | POA: Diagnosis not present

## 2017-08-03 DIAGNOSIS — M6281 Muscle weakness (generalized): Secondary | ICD-10-CM | POA: Diagnosis not present

## 2017-08-03 DIAGNOSIS — A419 Sepsis, unspecified organism: Secondary | ICD-10-CM | POA: Diagnosis not present

## 2017-08-04 DIAGNOSIS — D649 Anemia, unspecified: Secondary | ICD-10-CM | POA: Diagnosis not present

## 2017-08-04 DIAGNOSIS — R262 Difficulty in walking, not elsewhere classified: Secondary | ICD-10-CM | POA: Diagnosis not present

## 2017-08-04 DIAGNOSIS — E7800 Pure hypercholesterolemia, unspecified: Secondary | ICD-10-CM | POA: Diagnosis not present

## 2017-08-04 DIAGNOSIS — B9562 Methicillin resistant Staphylococcus aureus infection as the cause of diseases classified elsewhere: Secondary | ICD-10-CM | POA: Diagnosis not present

## 2017-08-04 DIAGNOSIS — R531 Weakness: Secondary | ICD-10-CM | POA: Diagnosis not present

## 2017-08-04 DIAGNOSIS — A419 Sepsis, unspecified organism: Secondary | ICD-10-CM | POA: Diagnosis not present

## 2017-08-04 DIAGNOSIS — M6281 Muscle weakness (generalized): Secondary | ICD-10-CM | POA: Diagnosis not present

## 2017-08-04 DIAGNOSIS — I272 Pulmonary hypertension, unspecified: Secondary | ICD-10-CM | POA: Diagnosis not present

## 2017-08-04 DIAGNOSIS — N390 Urinary tract infection, site not specified: Secondary | ICD-10-CM | POA: Diagnosis not present

## 2017-08-05 DIAGNOSIS — R531 Weakness: Secondary | ICD-10-CM | POA: Diagnosis not present

## 2017-08-05 DIAGNOSIS — A419 Sepsis, unspecified organism: Secondary | ICD-10-CM | POA: Diagnosis not present

## 2017-08-05 DIAGNOSIS — B9562 Methicillin resistant Staphylococcus aureus infection as the cause of diseases classified elsewhere: Secondary | ICD-10-CM | POA: Diagnosis not present

## 2017-08-05 DIAGNOSIS — R262 Difficulty in walking, not elsewhere classified: Secondary | ICD-10-CM | POA: Diagnosis not present

## 2017-08-05 DIAGNOSIS — N390 Urinary tract infection, site not specified: Secondary | ICD-10-CM | POA: Diagnosis not present

## 2017-08-05 DIAGNOSIS — M6281 Muscle weakness (generalized): Secondary | ICD-10-CM | POA: Diagnosis not present

## 2017-08-06 DIAGNOSIS — N390 Urinary tract infection, site not specified: Secondary | ICD-10-CM | POA: Diagnosis not present

## 2017-08-06 DIAGNOSIS — R531 Weakness: Secondary | ICD-10-CM | POA: Diagnosis not present

## 2017-08-06 DIAGNOSIS — R262 Difficulty in walking, not elsewhere classified: Secondary | ICD-10-CM | POA: Diagnosis not present

## 2017-08-06 DIAGNOSIS — A419 Sepsis, unspecified organism: Secondary | ICD-10-CM | POA: Diagnosis not present

## 2017-08-06 DIAGNOSIS — B9562 Methicillin resistant Staphylococcus aureus infection as the cause of diseases classified elsewhere: Secondary | ICD-10-CM | POA: Diagnosis not present

## 2017-08-06 DIAGNOSIS — M6281 Muscle weakness (generalized): Secondary | ICD-10-CM | POA: Diagnosis not present

## 2017-08-07 DIAGNOSIS — R531 Weakness: Secondary | ICD-10-CM | POA: Diagnosis not present

## 2017-08-07 DIAGNOSIS — N390 Urinary tract infection, site not specified: Secondary | ICD-10-CM | POA: Diagnosis not present

## 2017-08-07 DIAGNOSIS — R262 Difficulty in walking, not elsewhere classified: Secondary | ICD-10-CM | POA: Diagnosis not present

## 2017-08-07 DIAGNOSIS — M6281 Muscle weakness (generalized): Secondary | ICD-10-CM | POA: Diagnosis not present

## 2017-08-07 DIAGNOSIS — B9562 Methicillin resistant Staphylococcus aureus infection as the cause of diseases classified elsewhere: Secondary | ICD-10-CM | POA: Diagnosis not present

## 2017-08-07 DIAGNOSIS — A419 Sepsis, unspecified organism: Secondary | ICD-10-CM | POA: Diagnosis not present

## 2017-08-11 DIAGNOSIS — M6281 Muscle weakness (generalized): Secondary | ICD-10-CM | POA: Diagnosis not present

## 2017-08-11 DIAGNOSIS — B9562 Methicillin resistant Staphylococcus aureus infection as the cause of diseases classified elsewhere: Secondary | ICD-10-CM | POA: Diagnosis not present

## 2017-08-11 DIAGNOSIS — R531 Weakness: Secondary | ICD-10-CM | POA: Diagnosis not present

## 2017-08-11 DIAGNOSIS — A419 Sepsis, unspecified organism: Secondary | ICD-10-CM | POA: Diagnosis not present

## 2017-08-11 DIAGNOSIS — R262 Difficulty in walking, not elsewhere classified: Secondary | ICD-10-CM | POA: Diagnosis not present

## 2017-08-11 DIAGNOSIS — N390 Urinary tract infection, site not specified: Secondary | ICD-10-CM | POA: Diagnosis not present

## 2017-08-12 DIAGNOSIS — A419 Sepsis, unspecified organism: Secondary | ICD-10-CM | POA: Diagnosis not present

## 2017-08-12 DIAGNOSIS — B9562 Methicillin resistant Staphylococcus aureus infection as the cause of diseases classified elsewhere: Secondary | ICD-10-CM | POA: Diagnosis not present

## 2017-08-12 DIAGNOSIS — R262 Difficulty in walking, not elsewhere classified: Secondary | ICD-10-CM | POA: Diagnosis not present

## 2017-08-12 DIAGNOSIS — N390 Urinary tract infection, site not specified: Secondary | ICD-10-CM | POA: Diagnosis not present

## 2017-08-12 DIAGNOSIS — R531 Weakness: Secondary | ICD-10-CM | POA: Diagnosis not present

## 2017-08-12 DIAGNOSIS — M6281 Muscle weakness (generalized): Secondary | ICD-10-CM | POA: Diagnosis not present

## 2017-08-13 DIAGNOSIS — R531 Weakness: Secondary | ICD-10-CM | POA: Diagnosis not present

## 2017-08-13 DIAGNOSIS — R262 Difficulty in walking, not elsewhere classified: Secondary | ICD-10-CM | POA: Diagnosis not present

## 2017-08-13 DIAGNOSIS — A419 Sepsis, unspecified organism: Secondary | ICD-10-CM | POA: Diagnosis not present

## 2017-08-13 DIAGNOSIS — M6281 Muscle weakness (generalized): Secondary | ICD-10-CM | POA: Diagnosis not present

## 2017-08-13 DIAGNOSIS — N390 Urinary tract infection, site not specified: Secondary | ICD-10-CM | POA: Diagnosis not present

## 2017-08-13 DIAGNOSIS — Z79899 Other long term (current) drug therapy: Secondary | ICD-10-CM | POA: Diagnosis not present

## 2017-08-13 DIAGNOSIS — B9562 Methicillin resistant Staphylococcus aureus infection as the cause of diseases classified elsewhere: Secondary | ICD-10-CM | POA: Diagnosis not present

## 2017-08-14 DIAGNOSIS — R262 Difficulty in walking, not elsewhere classified: Secondary | ICD-10-CM | POA: Diagnosis not present

## 2017-08-14 DIAGNOSIS — A419 Sepsis, unspecified organism: Secondary | ICD-10-CM | POA: Diagnosis not present

## 2017-08-14 DIAGNOSIS — R531 Weakness: Secondary | ICD-10-CM | POA: Diagnosis not present

## 2017-08-14 DIAGNOSIS — N390 Urinary tract infection, site not specified: Secondary | ICD-10-CM | POA: Diagnosis not present

## 2017-08-14 DIAGNOSIS — M6281 Muscle weakness (generalized): Secondary | ICD-10-CM | POA: Diagnosis not present

## 2017-08-14 DIAGNOSIS — B9562 Methicillin resistant Staphylococcus aureus infection as the cause of diseases classified elsewhere: Secondary | ICD-10-CM | POA: Diagnosis not present

## 2017-08-15 DIAGNOSIS — Z5181 Encounter for therapeutic drug level monitoring: Secondary | ICD-10-CM | POA: Diagnosis not present

## 2017-08-18 ENCOUNTER — Ambulatory Visit: Payer: Medicare Other | Attending: Family | Admitting: Family

## 2017-08-18 ENCOUNTER — Other Ambulatory Visit: Payer: Self-pay

## 2017-08-18 ENCOUNTER — Encounter: Payer: Self-pay | Admitting: Family

## 2017-08-18 VITALS — BP 143/64 | HR 83 | Resp 18

## 2017-08-18 DIAGNOSIS — I272 Pulmonary hypertension, unspecified: Secondary | ICD-10-CM

## 2017-08-18 DIAGNOSIS — I5042 Chronic combined systolic (congestive) and diastolic (congestive) heart failure: Secondary | ICD-10-CM | POA: Diagnosis present

## 2017-08-18 DIAGNOSIS — Z79899 Other long term (current) drug therapy: Secondary | ICD-10-CM | POA: Insufficient documentation

## 2017-08-18 DIAGNOSIS — F17210 Nicotine dependence, cigarettes, uncomplicated: Secondary | ICD-10-CM | POA: Insufficient documentation

## 2017-08-18 DIAGNOSIS — I5032 Chronic diastolic (congestive) heart failure: Secondary | ICD-10-CM

## 2017-08-18 DIAGNOSIS — J449 Chronic obstructive pulmonary disease, unspecified: Secondary | ICD-10-CM | POA: Diagnosis not present

## 2017-08-18 DIAGNOSIS — T2000XS Burn of unspecified degree of head, face, and neck, unspecified site, sequela: Secondary | ICD-10-CM | POA: Insufficient documentation

## 2017-08-18 DIAGNOSIS — Z7951 Long term (current) use of inhaled steroids: Secondary | ICD-10-CM | POA: Insufficient documentation

## 2017-08-18 DIAGNOSIS — I110 Hypertensive heart disease with heart failure: Secondary | ICD-10-CM | POA: Diagnosis not present

## 2017-08-18 DIAGNOSIS — I10 Essential (primary) hypertension: Secondary | ICD-10-CM

## 2017-08-18 DIAGNOSIS — E785 Hyperlipidemia, unspecified: Secondary | ICD-10-CM | POA: Diagnosis not present

## 2017-08-18 DIAGNOSIS — A4902 Methicillin resistant Staphylococcus aureus infection, unspecified site: Secondary | ICD-10-CM | POA: Diagnosis not present

## 2017-08-18 DIAGNOSIS — I2510 Atherosclerotic heart disease of native coronary artery without angina pectoris: Secondary | ICD-10-CM | POA: Insufficient documentation
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
## 2017-08-18 NOTE — Patient Instructions (Signed)
Begin weighing daily and call for an overnight weight gain of > 2 pounds or a weekly weight gain of >5 pounds. 

## 2017-08-20 DIAGNOSIS — Z792 Long term (current) use of antibiotics: Secondary | ICD-10-CM | POA: Diagnosis not present

## 2017-08-20 DIAGNOSIS — M00012 Staphylococcal arthritis, left shoulder: Secondary | ICD-10-CM | POA: Diagnosis not present

## 2017-08-26 DIAGNOSIS — I10 Essential (primary) hypertension: Secondary | ICD-10-CM | POA: Diagnosis not present

## 2017-08-26 DIAGNOSIS — D649 Anemia, unspecified: Secondary | ICD-10-CM | POA: Diagnosis not present

## 2017-10-16 ENCOUNTER — Other Ambulatory Visit: Payer: Self-pay | Admitting: Orthopedic Surgery

## 2017-10-16 DIAGNOSIS — M00012 Staphylococcal arthritis, left shoulder: Secondary | ICD-10-CM

## 2017-10-18 ENCOUNTER — Ambulatory Visit
Admission: RE | Admit: 2017-10-18 | Discharge: 2017-10-18 | Disposition: A | Discharge status: Home / Self Care | Payer: Medicare Other | Source: Ambulatory Visit | Attending: Orthopedic Surgery | Admitting: Orthopedic Surgery

## 2017-10-18 DIAGNOSIS — Z96659 Presence of unspecified artificial knee joint: Secondary | ICD-10-CM | POA: Diagnosis present

## 2017-10-18 DIAGNOSIS — M00012 Staphylococcal arthritis, left shoulder: Principal | ICD-10-CM | POA: Diagnosis present

## 2017-10-18 DIAGNOSIS — M659 Synovitis and tenosynovitis, unspecified: Secondary | ICD-10-CM | POA: Insufficient documentation

## 2017-10-18 DIAGNOSIS — Z886 Allergy status to analgesic agent status: Secondary | ICD-10-CM

## 2017-10-18 DIAGNOSIS — Z9981 Dependence on supplemental oxygen: Secondary | ICD-10-CM

## 2017-10-18 DIAGNOSIS — I2510 Atherosclerotic heart disease of native coronary artery without angina pectoris: Secondary | ICD-10-CM | POA: Diagnosis present

## 2017-10-18 DIAGNOSIS — I110 Hypertensive heart disease with heart failure: Secondary | ICD-10-CM | POA: Diagnosis present

## 2017-10-18 DIAGNOSIS — I5042 Chronic combined systolic (congestive) and diastolic (congestive) heart failure: Secondary | ICD-10-CM | POA: Diagnosis not present

## 2017-10-18 DIAGNOSIS — M25412 Effusion, left shoulder: Secondary | ICD-10-CM | POA: Diagnosis present

## 2017-10-18 DIAGNOSIS — Z882 Allergy status to sulfonamides status: Secondary | ICD-10-CM | POA: Diagnosis not present

## 2017-10-18 DIAGNOSIS — E119 Type 2 diabetes mellitus without complications: Secondary | ICD-10-CM | POA: Diagnosis present

## 2017-10-18 DIAGNOSIS — B9562 Methicillin resistant Staphylococcus aureus infection as the cause of diseases classified elsewhere: Secondary | ICD-10-CM | POA: Diagnosis present

## 2017-10-18 DIAGNOSIS — S46012A Strain of muscle(s) and tendon(s) of the rotator cuff of left shoulder, initial encounter: Secondary | ICD-10-CM

## 2017-10-18 DIAGNOSIS — M25512 Pain in left shoulder: Secondary | ICD-10-CM | POA: Diagnosis present

## 2017-10-18 DIAGNOSIS — Z90710 Acquired absence of both cervix and uterus: Secondary | ICD-10-CM | POA: Diagnosis not present

## 2017-10-18 DIAGNOSIS — D62 Acute posthemorrhagic anemia: Secondary | ICD-10-CM | POA: Diagnosis not present

## 2017-10-18 DIAGNOSIS — Z87891 Personal history of nicotine dependence: Secondary | ICD-10-CM

## 2017-10-18 DIAGNOSIS — Z7951 Long term (current) use of inhaled steroids: Secondary | ICD-10-CM | POA: Diagnosis not present

## 2017-10-18 DIAGNOSIS — Z79899 Other long term (current) drug therapy: Secondary | ICD-10-CM | POA: Diagnosis not present

## 2017-10-18 DIAGNOSIS — E875 Hyperkalemia: Secondary | ICD-10-CM | POA: Diagnosis not present

## 2017-10-18 DIAGNOSIS — X58XXXA Exposure to other specified factors, initial encounter: Secondary | ICD-10-CM | POA: Insufficient documentation

## 2017-10-18 DIAGNOSIS — J449 Chronic obstructive pulmonary disease, unspecified: Secondary | ICD-10-CM | POA: Diagnosis present

## 2017-10-20 ENCOUNTER — Encounter
Admission: AD | Disposition: A | Discharge status: Home Health | Payer: Self-pay | Source: Ambulatory Visit | Attending: Internal Medicine

## 2017-10-20 ENCOUNTER — Inpatient Hospital Stay: Payer: Medicare Other | Admitting: Anesthesiology

## 2017-10-20 ENCOUNTER — Other Ambulatory Visit: Payer: Self-pay

## 2017-10-20 ENCOUNTER — Encounter: Payer: Self-pay | Admitting: Anesthesiology

## 2017-10-20 ENCOUNTER — Inpatient Hospital Stay
Admission: AD | Admit: 2017-10-20 | Discharge: 2017-10-24 | DRG: 501 | Disposition: A | Discharge status: Home Health | Payer: Medicare Other | Source: Ambulatory Visit | Attending: Internal Medicine | Admitting: Internal Medicine

## 2017-10-20 DIAGNOSIS — Z79899 Other long term (current) drug therapy: Secondary | ICD-10-CM | POA: Diagnosis not present

## 2017-10-20 DIAGNOSIS — J449 Chronic obstructive pulmonary disease, unspecified: Secondary | ICD-10-CM | POA: Diagnosis not present

## 2017-10-20 DIAGNOSIS — I5042 Chronic combined systolic (congestive) and diastolic (congestive) heart failure: Secondary | ICD-10-CM | POA: Diagnosis not present

## 2017-10-20 DIAGNOSIS — M25512 Pain in left shoulder: Secondary | ICD-10-CM | POA: Diagnosis present

## 2017-10-20 DIAGNOSIS — Z90710 Acquired absence of both cervix and uterus: Secondary | ICD-10-CM | POA: Diagnosis not present

## 2017-10-20 DIAGNOSIS — B9562 Methicillin resistant Staphylococcus aureus infection as the cause of diseases classified elsewhere: Secondary | ICD-10-CM | POA: Diagnosis not present

## 2017-10-20 DIAGNOSIS — D62 Acute posthemorrhagic anemia: Secondary | ICD-10-CM | POA: Diagnosis not present

## 2017-10-20 DIAGNOSIS — M00012 Staphylococcal arthritis, left shoulder: Secondary | ICD-10-CM | POA: Diagnosis present

## 2017-10-20 DIAGNOSIS — Z96659 Presence of unspecified artificial knee joint: Secondary | ICD-10-CM | POA: Diagnosis not present

## 2017-10-20 DIAGNOSIS — S46012A Strain of muscle(s) and tendon(s) of the rotator cuff of left shoulder, initial encounter: Secondary | ICD-10-CM | POA: Diagnosis not present

## 2017-10-20 DIAGNOSIS — M009 Pyogenic arthritis, unspecified: Secondary | ICD-10-CM | POA: Diagnosis present

## 2017-10-20 DIAGNOSIS — Z9981 Dependence on supplemental oxygen: Secondary | ICD-10-CM | POA: Diagnosis not present

## 2017-10-20 DIAGNOSIS — E119 Type 2 diabetes mellitus without complications: Secondary | ICD-10-CM | POA: Diagnosis not present

## 2017-10-20 DIAGNOSIS — R109 Unspecified abdominal pain: Secondary | ICD-10-CM

## 2017-10-20 DIAGNOSIS — Z7951 Long term (current) use of inhaled steroids: Secondary | ICD-10-CM | POA: Diagnosis not present

## 2017-10-20 DIAGNOSIS — Z87891 Personal history of nicotine dependence: Secondary | ICD-10-CM | POA: Diagnosis not present

## 2017-10-20 DIAGNOSIS — M25412 Effusion, left shoulder: Secondary | ICD-10-CM | POA: Diagnosis not present

## 2017-10-20 DIAGNOSIS — R059 Cough, unspecified: Secondary | ICD-10-CM

## 2017-10-20 DIAGNOSIS — Z886 Allergy status to analgesic agent status: Secondary | ICD-10-CM | POA: Diagnosis not present

## 2017-10-20 DIAGNOSIS — R05 Cough: Secondary | ICD-10-CM

## 2017-10-20 DIAGNOSIS — Z882 Allergy status to sulfonamides status: Secondary | ICD-10-CM | POA: Diagnosis not present

## 2017-10-20 DIAGNOSIS — I110 Hypertensive heart disease with heart failure: Secondary | ICD-10-CM | POA: Diagnosis not present

## 2017-10-20 DIAGNOSIS — E875 Hyperkalemia: Secondary | ICD-10-CM | POA: Diagnosis not present

## 2017-10-20 HISTORY — PX: SHOULDER ARTHROSCOPY: SHX128

## 2017-10-20 HISTORY — PX: IRRIGATION AND DEBRIDEMENT SHOULDER: SHX5880

## 2017-10-20 LAB — BASIC METABOLIC PANEL
ANION GAP: 9 (ref 5–15)
BUN: 25 mg/dL — ABNORMAL HIGH (ref 6–20)
CALCIUM: 8.5 mg/dL — AB (ref 8.9–10.3)
CO2: 21 mmol/L — ABNORMAL LOW (ref 22–32)
Chloride: 106 mmol/L (ref 101–111)
Creatinine, Ser: 0.81 mg/dL (ref 0.44–1.00)
GLUCOSE: 87 mg/dL (ref 65–99)
Potassium: 4.7 mmol/L (ref 3.5–5.1)
SODIUM: 136 mmol/L (ref 135–145)

## 2017-10-20 LAB — HEPATIC FUNCTION PANEL
ALBUMIN: 2.4 g/dL — AB (ref 3.5–5.0)
ALT: 11 U/L — ABNORMAL LOW (ref 14–54)
AST: 19 U/L (ref 15–41)
Alkaline Phosphatase: 75 U/L (ref 38–126)
TOTAL PROTEIN: 7.1 g/dL (ref 6.5–8.1)
Total Bilirubin: 0.4 mg/dL (ref 0.3–1.2)

## 2017-10-20 LAB — SURGICAL PCR SCREEN
MRSA, PCR: NEGATIVE
STAPHYLOCOCCUS AUREUS: NEGATIVE

## 2017-10-20 LAB — GLUCOSE, CAPILLARY
GLUCOSE-CAPILLARY: 49 mg/dL — AB (ref 65–99)
GLUCOSE-CAPILLARY: 80 mg/dL (ref 65–99)
GLUCOSE-CAPILLARY: 69 mg/dL (ref 65–99)
GLUCOSE-CAPILLARY: 86 mg/dL (ref 65–99)
Glucose-Capillary: 100 mg/dL — ABNORMAL HIGH (ref 65–99)

## 2017-10-20 LAB — LACTIC ACID, PLASMA: LACTIC ACID, VENOUS: 1.1 mmol/L (ref 0.5–1.9)

## 2017-10-20 SURGERY — IRRIGATION AND DEBRIDEMENT SHOULDER
Anesthesia: General | Site: Shoulder | Laterality: Left | Wound class: Dirty or Infected

## 2017-10-20 MED ORDER — VANCOMYCIN HCL IN DEXTROSE 1-5 GM/200ML-% IV SOLN: 1000.0000 mg | Freq: Two times a day (BID) | INTRAVENOUS | Status: DC

## 2017-10-20 MED ORDER — ONDANSETRON HCL 4 MG/2ML IJ SOLN: 4.0000 mg | Freq: Four times a day (QID) | INTRAMUSCULAR | Status: DC | PRN

## 2017-10-20 MED ORDER — BUPIVACAINE-EPINEPHRINE (PF) 0.5% -1:200000 IJ SOLN
INTRAMUSCULAR | Status: AC
  Filled 2017-10-20: qty 30

## 2017-10-20 MED ORDER — ENOXAPARIN SODIUM 40 MG/0.4ML ~~LOC~~ SOLN: 40.0000 mg | SUBCUTANEOUS | Status: DC

## 2017-10-20 MED ORDER — SUGAMMADEX SODIUM 200 MG/2ML IV SOLN
INTRAVENOUS | Status: DC | PRN
  Administered 2017-10-20: 130 mg via INTRAVENOUS

## 2017-10-20 MED ORDER — PHENYLEPHRINE HCL 10 MG/ML IJ SOLN
INTRAMUSCULAR | Status: DC | PRN
  Administered 2017-10-20 (×2): 100 ug via INTRAVENOUS

## 2017-10-20 MED ORDER — DEXTROSE 50 % IV SOLN
25.0000 mL | Freq: Once | INTRAVENOUS | Status: AC
  Administered 2017-10-20: 25 mL via INTRAVENOUS
  Filled 2017-10-20: qty 50

## 2017-10-20 MED ORDER — METOPROLOL SUCCINATE ER 50 MG PO TB24
50.0000 mg | ORAL_TABLET | Freq: Two times a day (BID) | ORAL | Status: DC
  Administered 2017-10-20 – 2017-10-24 (×9): 50 mg via ORAL
  Filled 2017-10-20 (×9): qty 1

## 2017-10-20 MED ORDER — LIDOCAINE HCL (CARDIAC) 20 MG/ML IV SOLN
INTRAVENOUS | Status: DC | PRN
  Administered 2017-10-20: 60 mg via INTRAVENOUS

## 2017-10-20 MED ORDER — HYDROCODONE-ACETAMINOPHEN 5-325 MG PO TABS: 1.0000 | ORAL_TABLET | ORAL | Status: DC | PRN

## 2017-10-20 MED ORDER — VANCOMYCIN HCL IN DEXTROSE 1-5 GM/200ML-% IV SOLN
1000.0000 mg | Freq: Once | INTRAVENOUS | Status: DC
  Filled 2017-10-20: qty 200

## 2017-10-20 MED ORDER — AMLODIPINE BESYLATE 10 MG PO TABS
10.0000 mg | ORAL_TABLET | Freq: Every day | ORAL | Status: DC
  Administered 2017-10-21 – 2017-10-24 (×4): 10 mg via ORAL
  Filled 2017-10-20: qty 1
  Filled 2017-10-20: qty 2
  Filled 2017-10-20 (×2): qty 1

## 2017-10-20 MED ORDER — SODIUM CHLORIDE 0.9 % IV SOLN: INTRAVENOUS | Status: DC

## 2017-10-20 MED ORDER — LACTATED RINGERS IV SOLN
INTRAVENOUS | Status: DC | PRN
  Administered 2017-10-20: 18:00:00 via INTRAVENOUS

## 2017-10-20 MED ORDER — ENOXAPARIN SODIUM 40 MG/0.4ML ~~LOC~~ SOLN
40.0000 mg | SUBCUTANEOUS | Status: DC
  Administered 2017-10-21 – 2017-10-24 (×4): 40 mg via SUBCUTANEOUS
  Filled 2017-10-20 (×4): qty 0.4

## 2017-10-20 MED ORDER — NEOMYCIN-POLYMYXIN B GU 40-200000 IR SOLN
Status: AC
  Filled 2017-10-20: qty 4

## 2017-10-20 MED ORDER — HYDROMORPHONE HCL 1 MG/ML IJ SOLN: 1.0000 mg | INTRAMUSCULAR | Status: DC | PRN

## 2017-10-20 MED ORDER — VANCOMYCIN HCL 1000 MG IV SOLR
INTRAVENOUS | Status: DC | PRN
  Administered 2017-10-20: 1000 mg via INTRAVENOUS

## 2017-10-20 MED ORDER — SUCCINYLCHOLINE CHLORIDE 20 MG/ML IJ SOLN
INTRAMUSCULAR | Status: DC | PRN
  Administered 2017-10-20: 80 mg via INTRAVENOUS

## 2017-10-20 MED ORDER — BUDESONIDE 0.5 MG/2ML IN SUSP
0.5000 mg | Freq: Two times a day (BID) | RESPIRATORY_TRACT | Status: DC
Start: 2017-10-20 — End: 2017-10-25
  Administered 2017-10-20 – 2017-10-24 (×8): 0.5 mg via RESPIRATORY_TRACT
  Filled 2017-10-20 (×8): qty 2

## 2017-10-20 MED ORDER — PIPERACILLIN-TAZOBACTAM 3.375 G IVPB: 3.3750 g | Freq: Four times a day (QID) | INTRAVENOUS | Status: DC

## 2017-10-20 MED ORDER — ACETAMINOPHEN 500 MG PO TABS
1000.0000 mg | ORAL_TABLET | Freq: Four times a day (QID) | ORAL | Status: DC
  Administered 2017-10-21 – 2017-10-24 (×13): 1000 mg via ORAL
  Filled 2017-10-20 (×14): qty 2

## 2017-10-20 MED ORDER — BISACODYL 5 MG PO TBEC: 5.0000 mg | DELAYED_RELEASE_TABLET | Freq: Every day | ORAL | Status: DC | PRN

## 2017-10-20 MED ORDER — FENTANYL CITRATE (PF) 100 MCG/2ML IJ SOLN
INTRAMUSCULAR | Status: DC | PRN
  Administered 2017-10-20 (×2): 50 ug via INTRAVENOUS

## 2017-10-20 MED ORDER — DEXAMETHASONE SODIUM PHOSPHATE 10 MG/ML IJ SOLN
INTRAMUSCULAR | Status: DC | PRN
  Administered 2017-10-20: 5 mg via INTRAVENOUS

## 2017-10-20 MED ORDER — PROPOFOL 10 MG/ML IV BOLUS
INTRAVENOUS | Status: AC
  Filled 2017-10-20: qty 20

## 2017-10-20 MED ORDER — ROCURONIUM BROMIDE 100 MG/10ML IV SOLN
INTRAVENOUS | Status: DC | PRN
  Administered 2017-10-20: 10 mg via INTRAVENOUS

## 2017-10-20 MED ORDER — ONDANSETRON HCL 4 MG PO TABS: 4.0000 mg | ORAL_TABLET | Freq: Four times a day (QID) | ORAL | Status: DC | PRN

## 2017-10-20 MED ORDER — PIPERACILLIN-TAZOBACTAM 3.375 G IVPB 30 MIN
3.3750 g | Freq: Once | INTRAVENOUS | Status: DC
  Administered 2017-10-20: 3.375 g via INTRAVENOUS
  Filled 2017-10-20: qty 50

## 2017-10-20 MED ORDER — PIPERACILLIN-TAZOBACTAM 3.375 G IVPB
INTRAVENOUS | Status: AC
  Administered 2017-10-20: 3.375 g via INTRAVENOUS
  Filled 2017-10-20: qty 50

## 2017-10-20 MED ORDER — GLUCOSE 40 % PO GEL: 1.0000 | Freq: Once | ORAL | Status: DC

## 2017-10-20 MED ORDER — TRAZODONE HCL 50 MG PO TABS
25.0000 mg | ORAL_TABLET | Freq: Every evening | ORAL | Status: DC | PRN
  Administered 2017-10-22 – 2017-10-23 (×2): 25 mg via ORAL
  Filled 2017-10-20 (×2): qty 1

## 2017-10-20 MED ORDER — SODIUM CHLORIDE 0.9% FLUSH: 10.0000 mL | INTRAVENOUS | Status: DC | PRN

## 2017-10-20 MED ORDER — DOCUSATE SODIUM 100 MG PO CAPS
100.0000 mg | ORAL_CAPSULE | Freq: Two times a day (BID) | ORAL | Status: DC
  Administered 2017-10-20 – 2017-10-24 (×8): 100 mg via ORAL
  Filled 2017-10-20 (×8): qty 1

## 2017-10-20 MED ORDER — ACETAMINOPHEN 650 MG RE SUPP: 650.0000 mg | Freq: Four times a day (QID) | RECTAL | Status: DC | PRN

## 2017-10-20 MED ORDER — PROPOFOL 10 MG/ML IV BOLUS
INTRAVENOUS | Status: DC | PRN
  Administered 2017-10-20: 100 mg via INTRAVENOUS

## 2017-10-20 MED ORDER — DEXTROSE-NACL 5-0.45 % IV SOLN
INTRAVENOUS | Status: DC
  Administered 2017-10-20: 11:00:00 via INTRAVENOUS

## 2017-10-20 MED ORDER — OXYCODONE HCL 5 MG PO TABS
5.0000 mg | ORAL_TABLET | ORAL | Status: DC | PRN
  Administered 2017-10-21 – 2017-10-24 (×6): 5 mg via ORAL
  Filled 2017-10-20 (×6): qty 1

## 2017-10-20 MED ORDER — NEOMYCIN-POLYMYXIN B GU 40-200000 IR SOLN
Status: DC | PRN
  Administered 2017-10-20: 4 mL

## 2017-10-20 MED ORDER — TIOTROPIUM BROMIDE MONOHYDRATE 18 MCG IN CAPS
18.0000 ug | ORAL_CAPSULE | Freq: Every day | RESPIRATORY_TRACT | Status: DC
  Administered 2017-10-20 – 2017-10-24 (×5): 18 ug via RESPIRATORY_TRACT
  Filled 2017-10-20: qty 5

## 2017-10-20 MED ORDER — GLUCOSE 40 % PO GEL
ORAL | Status: AC
  Filled 2017-10-20: qty 1

## 2017-10-20 MED ORDER — ONDANSETRON HCL 4 MG/2ML IJ SOLN
4.0000 mg | Freq: Four times a day (QID) | INTRAMUSCULAR | Status: DC | PRN
  Administered 2017-10-20: 4 mg via INTRAVENOUS

## 2017-10-20 MED ORDER — PIPERACILLIN-TAZOBACTAM 3.375 G IVPB
3.3750 g | Freq: Three times a day (TID) | INTRAVENOUS | Status: DC
  Administered 2017-10-21 – 2017-10-22 (×5): 3.375 g via INTRAVENOUS
  Filled 2017-10-20 (×12): qty 50

## 2017-10-20 MED ORDER — ACETAMINOPHEN 325 MG PO TABS: 650.0000 mg | ORAL_TABLET | Freq: Four times a day (QID) | ORAL | Status: DC | PRN

## 2017-10-20 MED ORDER — ONDANSETRON HCL 4 MG/2ML IJ SOLN: 4.0000 mg | Freq: Once | INTRAMUSCULAR | Status: DC | PRN

## 2017-10-20 MED ORDER — VANCOMYCIN HCL IN DEXTROSE 1-5 GM/200ML-% IV SOLN
INTRAVENOUS | Status: AC
  Filled 2017-10-20: qty 200

## 2017-10-20 MED ORDER — VANCOMYCIN HCL IN DEXTROSE 1-5 GM/200ML-% IV SOLN
1000.0000 mg | INTRAVENOUS | Status: DC
  Administered 2017-10-21 (×2): 1000 mg via INTRAVENOUS
  Filled 2017-10-20 (×4): qty 200

## 2017-10-20 MED ORDER — FENTANYL CITRATE (PF) 100 MCG/2ML IJ SOLN: 25.0000 ug | INTRAMUSCULAR | Status: DC | PRN

## 2017-10-20 MED ORDER — FENTANYL CITRATE (PF) 100 MCG/2ML IJ SOLN
INTRAMUSCULAR | Status: AC
  Filled 2017-10-20: qty 2

## 2017-10-20 SURGICAL SUPPLY — 41 items
CANISTER SUCT 1200ML W/VALVE (MISCELLANEOUS) ×3 IMPLANT
CANISTER SUCT 3000ML PPV (MISCELLANEOUS) ×3 IMPLANT
CHLORAPREP W/TINT 26ML (MISCELLANEOUS) ×3 IMPLANT
COOLER POLAR GLACIER W/PUMP (MISCELLANEOUS) ×3 IMPLANT
CORD BIP STRL DISP 12FT (MISCELLANEOUS) IMPLANT
DRAPE SHEET LG 3/4 BI-LAMINATE (DRAPES) ×3 IMPLANT
DRSG OPSITE POSTOP 4X6 (GAUZE/BANDAGES/DRESSINGS) ×3 IMPLANT
DRSG TEGADERM 2-3/8X2-3/4 SM (GAUZE/BANDAGES/DRESSINGS) ×6 IMPLANT
FORCEPS JEWEL BIP 4-3/4 STR (INSTRUMENTS) IMPLANT
GAUZE SPONGE 4X4 12PLY STRL (GAUZE/BANDAGES/DRESSINGS) ×3 IMPLANT
GAUZE SPONGE NON-WVN 2X2 STRL (MISCELLANEOUS) IMPLANT
GAUZE XEROFORM 4X4 STRL (GAUZE/BANDAGES/DRESSINGS) ×3 IMPLANT
GLOVE BIO SURGEON STRL SZ8 (GLOVE) ×6 IMPLANT
GLOVE INDICATOR 8.0 STRL GRN (GLOVE) ×3 IMPLANT
GOWN STRL REUS W/ TWL LRG LVL3 (GOWN DISPOSABLE) ×2 IMPLANT
GOWN STRL REUS W/ TWL XL LVL3 (GOWN DISPOSABLE) ×1 IMPLANT
GOWN STRL REUS W/TWL LRG LVL3 (GOWN DISPOSABLE) ×4
GOWN STRL REUS W/TWL XL LVL3 (GOWN DISPOSABLE) ×2
HANDPIECE INTERPULSE COAX TIP (DISPOSABLE)
HEMOVAC 400CC 10FR (MISCELLANEOUS) ×3 IMPLANT
KIT STABILIZATION SHOULDER (MISCELLANEOUS) ×3 IMPLANT
KIT TURNOVER KIT A (KITS) ×3 IMPLANT
MASK FACE SPIDER DISP (MASK) ×3 IMPLANT
MAT BLUE FLOOR 46X72 FLO (MISCELLANEOUS) ×3 IMPLANT
NS IRRIG 500ML POUR BTL (IV SOLUTION) ×3 IMPLANT
PACK EXTREMITY ARMC (MISCELLANEOUS) ×3 IMPLANT
PAD CAST CTTN 4X4 STRL (SOFTGOODS) IMPLANT
PAD WRAPON POLAR SHDR XLG (MISCELLANEOUS) ×1 IMPLANT
PADDING CAST COTTON 4X4 STRL (SOFTGOODS)
SET HNDPC FAN SPRY TIP SCT (DISPOSABLE) IMPLANT
SLING ARM LRG DEEP (SOFTGOODS) IMPLANT
SOL .9 NS 3000ML IRR  AL (IV SOLUTION) ×34
SOL .9 NS 3000ML IRR UROMATIC (IV SOLUTION) ×17 IMPLANT
SPONGE VERSALON 2X2 STRL (MISCELLANEOUS)
STOCKINETTE STRL 4IN 9604848 (GAUZE/BANDAGES/DRESSINGS) ×3 IMPLANT
STRAP SAFETY 5IN WIDE (MISCELLANEOUS) ×3 IMPLANT
SUT ETHILON 3-0 (SUTURE) ×3 IMPLANT
SUT PROLENE 4 0 PS 2 18 (SUTURE) IMPLANT
SWAB CULTURE AMIES ANAERIB BLU (MISCELLANEOUS) ×12 IMPLANT
SWAB DUAL CULTURE TRANS RED ST (MISCELLANEOUS) ×9 IMPLANT
WRAPON POLAR PAD SHDR XLG (MISCELLANEOUS) ×3

## 2017-10-20 NOTE — OR Nursing (Signed)
Notified Dr Karlton LemonKarenz of FBS of 80. He is ok with this. No new orders at this time. Patient does have D5 0.45% NS running.

## 2017-10-20 NOTE — [place] FL2 (Signed)
MEDICAID FL2 LEVEL OF CARE SCREENING TOOL     IDENTIFICATION  Patient Name: Helen Moore Birthdate: [birth year] Sex: female Admission Date (Current Location): 10/20/2017  [facility] and [place] Number:  [address] ([id number]) Facility and Address:  [facility], [address], [place], [place] [address]      Provider Number: [id number]  Attending Physician Name and Address:  Katha Hamming, MD  Relative Name and Phone Number:       Current Level of Care: Hospital Recommended Level of Care: Skilled Nursing Facility Prior Approval Number:    Date Approved/Denied:   PASRR Number: [id number])  Discharge Plan: SNF    Current Diagnoses: Patient Active Problem List   Diagnosis Date Noted  . Septic arthritis (HCC) 10/20/2017  . UTI (urinary tract infection) 07/24/2017  . Sepsis (HCC) 05/04/2017  . Chronic diastolic heart failure (HCC) 11/08/2016  . Pulmonary hypertension (HCC)   . Herpes zoster 07/30/2016  . Chronic bronchitis with acute exacerbation (HCC) 07/30/2016  . Osteoarthritis of left knee 07/12/2016  . Urinary frequency 07/12/2016  . Thoracic aortic atherosclerosis (HCC) 07/12/2016  . Noncompliance with diagnostic testing 07/12/2016  . Vitamin B12 deficiency 07/10/2015  . Medication monitoring encounter 07/10/2015  . Cough 07/10/2015  . Arthritis   . Tobacco abuse   . COPD exacerbation (HCC)   . Essential hypertension 06/08/2014  . Murmur 06/08/2014  . Tachycardia 06/08/2014  . Hyperlipidemia 06/08/2014  . Aortic valve regurgitation 06/08/2014  . Coronary artery disease 06/08/2014    Orientation RESPIRATION BLADDER Height & Weight     Self, Time, Situation, Place  Normal Continent Weight: 140 lb 8 oz (63.7 kg) Height:  5\' 6"  (167.6 cm)  BEHAVIORAL SYMPTOMS/MOOD NEUROLOGICAL BOWEL NUTRITION STATUS      Continent Diet(NPO )  AMBULATORY STATUS COMMUNICATION OF NEEDS Skin   Extensive Assist Verbally Normal                        Personal Care Assistance Level of Assistance  Bathing, Feeding, Dressing Bathing Assistance: Limited assistance Feeding assistance: Independent Dressing Assistance: Limited assistance     Functional Limitations Info  Sight, Hearing, Speech Sight Info: Adequate Hearing Info: Adequate Speech Info: Adequate    SPECIAL CARE FACTORS FREQUENCY  PT (By licensed PT), OT (By licensed OT)(PICC line for IV ABX. )     PT Frequency: (5) OT Frequency: (5)            Contractures      Additional Factors Info  Code Status, Allergies, Isolation Precautions Code Status Info: (Full Code. ) Allergies Info: (Motrin Ibuprofen, Sulfur)     Isolation Precautions Info: (MRSA )     Current Medications (10/20/2017):  This is the current hospital active medication list Current Facility-Administered Medications  Medication Dose Route Frequency Provider Last Rate Last Dose  . 0.9 %  sodium chloride infusion   Intravenous Continuous Katha Hamming, MD      . acetaminophen (TYLENOL) tablet 650 mg  650 mg Oral Q6H PRN Katha Hamming, MD       Or  . acetaminophen (TYLENOL) suppository 650 mg  650 mg Rectal Q6H PRN Katha Hamming, MD      . amLODipine (NORVASC) tablet 10 mg  10 mg Oral Daily Katha Hamming, MD      . bisacodyl (DULCOLAX) EC tablet 5 mg  5 mg Oral Daily PRN Katha Hamming, MD      . budesonide (PULMICORT) nebulizer solution 0.5 mg  0.5 mg  Nebulization BID Katha HammingKonidena, Snehalatha, MD   0.5 mg at 10/20/17 1031  . dextrose (GLUTOSE) 40 % oral gel           . dextrose 5 %-0.45 % sodium chloride infusion   Intravenous Continuous Katha HammingKonidena, Snehalatha, MD 75 mL/hr at 10/20/17 1047    . docusate sodium (COLACE) capsule 100 mg  100 mg Oral BID Katha HammingKonidena, Snehalatha, MD      . HYDROcodone-acetaminophen (NORCO/VICODIN) 5-325 MG per tablet 1-2 tablet  1-2 tablet Oral Q4H PRN Katha HammingKonidena, Snehalatha, MD      . HYDROmorphone (DILAUDID) injection 1 mg  1  mg Intravenous Q4H PRN Katha HammingKonidena, Snehalatha, MD      . metoprolol succinate (TOPROL-XL) 24 hr tablet 50 mg  50 mg Oral BID Katha HammingKonidena, Snehalatha, MD   50 mg at 10/20/17 1047  . ondansetron (ZOFRAN) tablet 4 mg  4 mg Oral Q6H PRN Katha HammingKonidena, Snehalatha, MD       Or  . ondansetron (ZOFRAN) injection 4 mg  4 mg Intravenous Q6H PRN Katha HammingKonidena, Snehalatha, MD      . tiotropium (SPIRIVA) inhalation capsule 18 mcg  18 mcg Inhalation Daily Katha HammingKonidena, Snehalatha, MD   18 mcg at 10/20/17 1046  . traZODone (DESYREL) tablet 25 mg  25 mg Oral QHS PRN Katha HammingKonidena, Snehalatha, MD         Discharge Medications: Please see discharge summary for a list of discharge medications.  Relevant Imaging Results:  Relevant Lab Results:   Additional Information (SSN: [id number])  Helen Moore, Darleen CrockerBailey M, LCSW

## 2017-10-20 NOTE — Clinical Social Work Note (Signed)
Clinical Social Work Assessment  Patient Details  Name: Mitzi Lilja MRN: [id number] Date of Birth: 11-26-1934  Date of referral:  10/20/17               Reason for consult:  Facility Placement                Permission sought to share information with:  Chartered certified accountant granted to share information::  Yes, Verbal Permission Granted  Name::      [place]::   [place]   Relationship::     Contact Information:     Housing/Transportation Living arrangements for the past 2 months:  [place] of Information:  Patient, Facility Patient Interpreter Needed:  None Criminal Activity/Legal Involvement Pertinent to Current Situation/Hospitalization:  No - Comment as needed Significant Relationships:  Siblings Lives with:  Self Do you feel safe going back to the place where you live?  Yes Need for family participation in patient care:  Yes (Comment)  Care giving concerns:  Per patient she lives alone in [place] and her sister Karin Lieu is her primary support [phone].     Social Worker assessment / plan:  Holiday representative (CSW) received SNF consult. PT is pending. Patient may need PICC for long term IV ABX. CSW met with patient alone at bedside to discuss D/C plan. Patient was alert and oriented X4 and was laying in the bed. CSW introduced self and explained role of CSW department. On patient's last admission to [facility] in November 2018 it was found that she does not have medicare part A and has part B only and medicaid. This was confirmed on this admission as well. Patient went to [facility] on the last admission. CSW met with patient alone at bedside and made her aware of above. Patient reported that she has been working with PT at home and prefers to go home but is open to SNF if needed. FL2 complete and faxed out. CSW will continue to follow and assist as needed.   Employment status:  Disabled (Comment on  whether or not currently receiving Disability), Retired Forensic scientist:  Medicaid In [place], [place] PT Recommendations:  Not assessed at this time Information / Referral to community resources:  [place]  Patient/Family's Response to care:  Patient is open to SNF if needed.   Patient/Family's Understanding of and Emotional Response to Diagnosis, Current Treatment, and Prognosis:  Patient was pleasant and thanked CSW for assistance.   Emotional Assessment Appearance:  Appears stated age Attitude/Demeanor/Rapport:    Affect (typically observed):  Accepting, Adaptable, Pleasant Orientation:  Oriented to Self, Oriented to Place, Oriented to  Time, Oriented to Situation Alcohol / Substance use:  Not Applicable Psych involvement (Current and /or in the community):  No (Comment)  Discharge Needs  Concerns to be addressed:  Discharge Planning Concerns Readmission within the last 30 days:  No Current discharge risk:  Dependent with Mobility Barriers to Discharge:  Continued Medical Work up   [employer], Veronia Beets, LCSW 10/20/2017, 4:55 PM

## 2017-10-20 NOTE — Progress Notes (Signed)
IV team here to help with pre-op labs. MD Signa KellSunny Patel paged and asked if blood cultures were needed to be collected. This nurse received verbal orders for blood cultures. Order placed.

## 2017-10-20 NOTE — Anesthesia Post-op Follow-up Note (Signed)
Anesthesia QCDR form completed.        

## 2017-10-20 NOTE — Consult Note (Signed)
Pharmacy Antibiotic Note  Helen CraneGertrude Moore is a 83 y.o. female admitted on 10/20/2017 with  Septic joint.  Pharmacy has been consulted for vancomycin dosing. Patient received vancomycin 1g IV 2/4 @ 1942 (pre-op)   Plan: Ke: 0.046   T1/2: 15   Vd: 46  Start Vancomycin 1g IV every 18 hours with 6 hour stack dosing. Calculated trough at Css is 18.5. Trough level ordered prior to 4th dose. Will monitor renal function and adjust dose as needed.   Height: 5\' 6"  (167.6 cm) Weight: 140 lb 8 oz (63.7 kg) IBW/kg (Calculated) : 59.3  Temp (24hrs), Avg:98.7 F (37.1 C), Min:97.8 F (36.6 C), Max:99.9 F (37.7 C)  Recent Labs  Lab 10/20/17 1409  CREATININE 0.81  LATICACIDVEN 1.1    Estimated Creatinine Clearance: 51.9 mL/min (by C-G formula based on SCr of 0.81 mg/dL).    Allergies  Allergen Reactions  . Motrin [Ibuprofen]   . Sulfur     Antimicrobials this admission: 2/4 vancomycin >>   Dose adjustments this admission:  Microbiology results: 2/4  BCx: pending  2/4  MRSA PCR: negative   Thank you for allowing pharmacy to be a part of this patient's care.  Gardner CandleSheema M Ebany Bowermaster, PharmD, BCPS Clinical Pharmacist 10/20/2017 7:58 PM
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
## 2017-10-20 NOTE — Anesthesia Procedure Notes (Signed)
Procedure Name: Intubation Date/Time: 10/20/2017 5:48 PM Performed by: Aline Brochure, CRNA Pre-anesthesia Checklist: Patient identified, Emergency Drugs available, Suction available and Patient being monitored Patient Re-evaluated:Patient Re-evaluated prior to induction Oxygen Delivery Method: Circle system utilized Preoxygenation: Pre-oxygenation with 100% oxygen Induction Type: IV induction Ventilation: Mask ventilation without difficulty Laryngoscope Size: Mac and 3 Grade View: Grade I Tube type: Oral Tube size: 7.0 mm Number of attempts: 1 Airway Equipment and Method: Stylet Placement Confirmation: ETT inserted through vocal cords under direct vision,  positive ETCO2 and breath sounds checked- equal and bilateral Secured at: 21 cm Tube secured with: Tape Dental Injury: Teeth and Oropharynx as per pre-operative assessment
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
## 2017-10-20 NOTE — Transfer of Care (Signed)
Immediate Anesthesia Transfer of Care Note  Patient: Helen Moore  Procedure(s) Performed: IRRIGATION AND DEBRIDEMENT SHOULDER (Left Shoulder) ARTHROSCOPY SHOULDER (Left Shoulder)  Patient Location: PACU  Anesthesia Type:General  Level of Consciousness: awake  Airway & Oxygen Therapy: Patient Spontanous Breathing and Patient connected to face mask oxygen  Post-op Assessment: Report given to RN and Post -op Vital signs reviewed and stable  Post vital signs: Reviewed and stable  Last Vitals:  Vitals:   10/20/17 1053 10/20/17 1433  BP: (!) 151/75 (!) 146/77  Pulse: 96 96  Resp: 20 17  Temp: 36.8 C 37.7 C  SpO2: 94% 94%    Last Pain:  Vitals:   10/20/17 1433  TempSrc: Tympanic  PainSc:          Complications: No apparent anesthesia complications

## 2017-10-20 NOTE — Progress Notes (Signed)
Verified patient's Medicare Part A coverage.  [facility] [phone]), patient does not have active Part A coverage.  Per their records, her Part A entitlement began on 06/16/04 and termed on 02/13/14.   Confirmed through [employer] that patient is eligible for Medicare Part B only.
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
## 2017-10-20 NOTE — Progress Notes (Signed)
Patients blood glucose 49 at 8:25. MD Luberta MutterKonidena paged and received verbal orders for 25 mls Dex 50% amp. Order placed.

## 2017-10-20 NOTE — Op Note (Signed)
Operative Note   SURGERY DATE: 10/20/2017  PRE-OP DIAGNOSIS:  1. Septic arthritis of left shoulder  POST-OP DIAGNOSIS: 1. Septic arthritis of left shoulder  PROCEDURES:  1. Arthroscopic Irrigation and debridement of L shoulder 2. Extensive glenohumeral debridement of L shoulder 3. Complete synovectomy of L shoulder  SURGEON: Rosealee Albee, MD  ASSISTANTS: none  ANESTHESIA: Gen   ESTIMATED BLOOD LOSS:5cc  TOTAL IV FLUIDS: per anesthesia  DRAIN: Hemovac x 1   INDICATION(S):  Helen Moore is a 83 y.o. female with a history of L shoulder septic arthritis s/p open I&D L shoulder on 07/25/18 with cultures growin  MRSA. She underwent IV Vancomycin treatment x 6 weeks and was supposed to be on an additional oral course of doxycycline, but it is unclear as to whether this was actually given. She had a dramatic increase in her CRP to 187 and so there was concern for recurrent septic arthritis of L shoulder. Clinical examination was not overly concerning on 10/16/17. STAT MRI was ordered and could not be completed until 10/18/17. Imaging was reviewed and discussed with [facility] radiologist and there was agreement that it was likely recurrent septic arthritis. After discussion of risks, benefits, and alternatives to surgery, the patient elected to proceed.   OPERATIVE FINDINGS: significant purulent material of subacromial space, subdeltoid space, and glenohumeral joint as well as infected subacromial bursa; torn and retracted supraspinatus tendon  Examination under anesthesia: A careful examination under anesthesia was performed.  Passive range of motion was: FF: 120; ER at side: 20; ER in abduction: NT; IR in abduction: NT.  Anterior load shift: NT.  Posterior load shift: NT.  Sulcus in neutral: NT.  Sulcus in ER: NT.    Intra-operative findings: A thorough arthroscopic examination of the shoulder was performed.  The findings are: 1. Biceps tendon: not visualized 2. Superior labrum:  degenerative 3. Posterior labrum and capsule: calcified labrum 4. Inferior capsule and inferior recess: normal 5. Glenoid cartilage surface: Diffuse Grade 4 degenerative changes 6. Supraspinatus attachment: Full-thickness tear  7. Posterior rotator cuff attachment: normal 8. Humeral head articular cartilage: Diffuse Grade 4 degenerative changes, porous bony surface with ability to easily impact and fragment bone 9. Rotator interval: normal 10: Subscapularis tendon: attachment intact, mild superior fraying 11. Anterior labrum: calfcified 12. IGHL: normal   OPERATIVE REPORT:  I identified Helen Moore the pre-operative holding area. Informed consent was obtained and the surgical site was marked. I reviewed the risks and benefits of the proposed surgical intervention and the patient (and/or patient's guardian) wished to proceed. The patient was transferred to the operative suite and general anesthesia was administered. The patient was placed in the beach chair position with the head of the bed elevated approximately 80 degrees. All down side pressure points were appropriately padded. The extremity was then prepped and draped in standard fashion. A time out was performed confirming the correct extremity, correct patient, and correct procedure.   I then created a standard posterior portal with an 11 blade. The glenohumeral joint was entered with a blunt trocar. Culture samples were obtained by pushing a culture swab into the trocar. The arthroscope was introduced but due to bony fragmentation nothing could be well-visualized. Therefore, the arthroscope was then placed in the subacromial space. Another culture swab was taken of the fluid in the subacromial space. An accessory lateral portal was established. There was significant subdeltoid purulent material, which was cultured and evacuated. A complete subacromial bursectomy and debridement of the gutters, including well lateral to  the humeral  head was carried out with a shaver.  ArthroCare was used to control bleeding. Copious fluid was run through the joint until all returned fluid was clear. The glenohumeral joint was entered through the rotator cuff tear. A switching stick was used to enter the glenohumeral joint from the posterior portal and arthroscope was inserted over this switching stick. The findings of diagnostic arthroscopy are described above. There was significant fragmentation of the posterior humeral head and bony debris was removed. I debrided degenerative tissue including labrum and cartilaginous surfaces. I excised and then also coagulated the inflamed synovium to obtain hemostasis and reduce the risk of post-operative swelling using an Arthrocare radiofrequency device.  Copious fluid was run through the joint until all returned fluid was clear. A Hemovac drain was placed into the joint. Broad spectrum IV antibiotics were administered after culture samples were obtained.  We closed the portals with 3-0 Nylon sutures. Wounds were covered with xeroform and a sterile dressing. PolarCare and sling were applied. The patient was awakened from anesthesia without complication and taken to the PACU for further recovery.  POSTOPERATIVE PLAN: The patient will return to the floor. Continue broad-spectrum IV Abx. ID Consult. F/U OR Culture results. Can start DVT ppx on POD#1. Maintain drains until at least POD#2.

## 2017-10-20 NOTE — Clinical Social Work Placement (Signed)
   CLINICAL SOCIAL WORK PLACEMENT  NOTE  Date:  10/20/2017  Patient Details  Name: Gershon CraneGertrude Feig MRN: [id number] Date of Birth: Oct 22, 1934  Clinical Social Work is seeking post-discharge placement for this patient at the Skilled  Nursing Facility level of care (*CSW will initial, date and re-position this form in  chart as items are completed):  Yes   Patient/family provided with [facility] Clinical Social Work Department's list of facilities offering this level of care within the geographic area requested by the patient (or if unable, by the patient's family).  Yes   Patient/family informed of their freedom to choose among providers that offer the needed level of care, that participate in Medicare, Medicaid or managed care program needed by the patient, have an available bed and are willing to accept the patient.  Yes   Patient/family informed of [facility]'s ownership interest in [facility] and [facility], as well as of the fact that they are under no obligation to receive care at these facilities.  PASRR submitted to EDS on       PASRR number received on       Existing PASRR number confirmed on 10/20/17     FL2 transmitted to all facilities in geographic area requested by pt/family on 10/20/17     FL2 transmitted to all facilities within larger geographic area on       Patient informed that his/her managed care company has contracts with or will negotiate with certain facilities, including the following:            Patient/family informed of bed offers received.  Patient chooses bed at       Physician recommends and patient chooses bed at      Patient to be transferred to   on  .  Patient to be transferred to facility by       Patient family notified on   of transfer.  Name of family member notified:        PHYSICIAN       Additional Comment:    _______________________________________________ Ercia Crisafulli, Darleen CrockerBailey M, LCSW 10/20/2017, 4:55 PM

## 2017-10-21 ENCOUNTER — Encounter: Payer: Self-pay | Admitting: Orthopedic Surgery

## 2017-10-21 DIAGNOSIS — M25512 Pain in left shoulder: Secondary | ICD-10-CM | POA: Diagnosis not present

## 2017-10-21 DIAGNOSIS — M00012 Staphylococcal arthritis, left shoulder: Secondary | ICD-10-CM | POA: Diagnosis not present

## 2017-10-21 LAB — CBC
HEMATOCRIT: 24.3 % — AB (ref 35.0–47.0)
HEMOGLOBIN: 7.8 g/dL — AB (ref 12.0–16.0)
MCH: 27.3 pg (ref 26.0–34.0)
MCHC: 32.2 g/dL (ref 32.0–36.0)
MCV: 84.8 fL (ref 80.0–100.0)
Platelets: 507 10*3/uL — ABNORMAL HIGH (ref 150–440)
RBC: 2.87 MIL/uL — ABNORMAL LOW (ref 3.80–5.20)
RDW: 23.9 % — AB (ref 11.5–14.5)
WBC: 5.4 10*3/uL (ref 3.6–11.0)

## 2017-10-21 LAB — BASIC METABOLIC PANEL
ANION GAP: 5 (ref 5–15)
BUN: 17 mg/dL (ref 6–20)
CHLORIDE: 106 mmol/L (ref 101–111)
CO2: 26 mmol/L (ref 22–32)
Calcium: 8.3 mg/dL — ABNORMAL LOW (ref 8.9–10.3)
Creatinine, Ser: 0.76 mg/dL (ref 0.44–1.00)
GFR calc Af Amer: >60 mL/min (ref >60)
GFR calc non Af Amer: >60 mL/min (ref >60)
GLUCOSE: 173 mg/dL — AB (ref 65–99)
POTASSIUM: 5.2 mmol/L — AB (ref 3.5–5.1)
Sodium: 137 mmol/L (ref 135–145)

## 2017-10-21 LAB — GLUCOSE, CAPILLARY: GLUCOSE-CAPILLARY: 144 mg/dL — AB (ref 65–99)

## 2017-10-21 LAB — C-REACTIVE PROTEIN: CRP: 19.3 mg/dL — AB (ref <1.0)

## 2017-10-21 LAB — LACTIC ACID, PLASMA: Lactic Acid, Venous: 1.3 mmol/L (ref 0.5–1.9)

## 2017-10-21 MED ORDER — OXYCODONE HCL 5 MG PO TABS: 5.0000 mg | ORAL_TABLET | ORAL | 0 refills | Status: DC | PRN

## 2017-10-21 MED ORDER — ENSURE ENLIVE PO LIQD
237.0000 mL | Freq: Two times a day (BID) | ORAL | Status: DC
  Administered 2017-10-21 – 2017-10-24 (×6): 237 mL via ORAL

## 2017-10-21 MED ORDER — ADULT MULTIVITAMIN W/MINERALS CH
1.0000 | ORAL_TABLET | Freq: Every day | ORAL | Status: DC
  Administered 2017-10-21 – 2017-10-24 (×4): 1 via ORAL
  Filled 2017-10-21 (×4): qty 1

## 2017-10-21 NOTE — Progress Notes (Signed)
ID E note Reviewed op note. Cx pending  Rec  Will plan on 6 week course of Vancomycin  Place PICC on 2/6 as long as bcx remain negative

## 2017-10-21 NOTE — Progress Notes (Signed)
Chaplain was rounding and nurse suggested visiting Pt. Chaplain was visiting and family came. Ch prayed with Pt and family and will follow up.   10/21/17 1000  Clinical Encounter Type  Visited With Patient and family together  Visit Type Initial;Spiritual support  Referral From Nurse  Spiritual Encounters  Spiritual Needs Prayer;Emotional
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
## 2017-10-21 NOTE — Progress Notes (Signed)
Patient is refusing SNF and wants to go home. Clinical Social Worker (CSW) contacted patient's sister Prudy FeelerOra to discuss D/C plan. CSW made sister aware that [facility] bed [facility] because patient has medicare part B only and medicaid. Sister [facility] and stated that she will take patient home and hire caregivers. CSW explained to sister that patient is total care at this point at not getting out of bed. CSW also explained that patient may need IV ABX. Sister verbalized her understanding and reported that she will take patient home and hire caregivers. Sister reported that patient is not far from her baseline and was getting a hospital bed in the home this week. RN case manager aware of above. CSW will continue to follow and assist as needed.   [employer] Hughes IncorporatedBailey Elfrida Pixley, LCSW [phone]
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
## 2017-10-21 NOTE — Anesthesia Postprocedure Evaluation (Signed)
Anesthesia Post Note  Patient: Helen Moore  Procedure(s) Performed: IRRIGATION AND DEBRIDEMENT SHOULDER (Left Shoulder) ARTHROSCOPY SHOULDER (Left Shoulder)  Patient location during evaluation: PACU Anesthesia Type: General Level of consciousness: awake and alert Pain management: pain level controlled Vital Signs Assessment: post-procedure vital signs reviewed and stable Respiratory status: spontaneous breathing, nonlabored ventilation, respiratory function stable and patient connected to nasal cannula oxygen Cardiovascular status: blood pressure returned to baseline and stable Postop Assessment: no apparent nausea or vomiting Anesthetic complications: no     Last Vitals:  Vitals:   10/20/17 2137 10/21/17 0007  BP: (!) 157/82 (!) 152/79  Pulse: 88 84  Resp: 16 16  Temp: 36.7 C 37 C  SpO2: 95% 97%    Last Pain:  Vitals:   10/21/17 0007  TempSrc: Oral  PainSc:                  [name] S

## 2017-10-21 NOTE — Progress Notes (Signed)
[facility] - [place] [facility]   PATIENT NAME: Helen Moore    MR#:  [id number]  DATE OF BIRTH:  03-Jun-1935  SUBJECTIVE: Seen at the bedside, patient had left shoulder debridement yesterday and she says she is not going to rehab and does not want to go to rehab she has caregivers at home and she is adamant that she wants to go home.  CHIEF COMPLAINT:  No chief complaint on file.   REVIEW OF SYSTEMS:   ROS CONSTITUTIONAL: No fever, fatigue or weakness.  EYES: No blurred or double vision.  EARS, NOSE, AND THROAT: No tinnitus or ear pain.  RESPIRATORY: No cough, shortness of breath, wheezing or hemoptysis.  CARDIOVASCULAR: No chest pain, orthopnea, edema.  GASTROINTESTINAL: No nausea, vomiting, diarrhea or abdominal pain.  GENITOURINARY: No dysuria, hematuria.  ENDOCRINE: No polyuria, nocturia,  HEMATOLOGY: No anemia, easy bruising or bleeding SKIN: No rash or lesion. MUSCULOSKELETAL: Left shoulder sling present   NEUROLOGIC: No tingling, numbness, weakness.  PSYCHIATRY: No anxiety or depression.   DRUG ALLERGIES:   Allergies  Allergen Reactions  . Motrin [Ibuprofen]   . Sulfur     VITALS:  Blood pressure 140/67, pulse 72, temperature 98.3 F (36.8 C), temperature source Oral, resp. rate 16, height 5\' 6"  (1.676 m), weight 65.5 kg (144 lb 6.4 oz), SpO2 94 %.  PHYSICAL EXAMINATION:  GENERAL:  83 y.o.-year-old patient lying in the bed with no acute distress.  EYES: Pupils equal, round, reactive to light and accommodation. No scleral icterus. Extraocular muscles intact.  HEENT: Head atraumatic, normocephalic. Oropharynx and nasopharynx clear.  NECK:  Supple, no jugular venous distention. No thyroid enlargement, no tenderness.  LUNGS: Normal breath sounds bilaterally, no wheezing, rales,rhonchi or crepitation. No use of accessory muscles of respiration.  CARDIOVASCULAR: S1, S2 normal. No murmurs, rubs, or gallops.  ABDOMEN: Soft, nontender,  nondistended. Bowel sounds present. No organomegaly or mass.  EXTREMITIES: No pedal edema, cyanosis, or clubbing.  She has sling for the left shoulder. NEUROLOGIC: Cranial nerves II through XII are intact. Muscle strength 5/5 in all extremities. Sensation intact. Gait not checked.  PSYCHIATRIC: The patient is alert and oriented x 3.  SKIN: No obvious rash, lesion, or ulcer.    LABORATORY PANEL:   CBC Recent Labs  Lab 10/21/17 0503  WBC 5.4  HGB 7.8*  HCT 24.3*  PLT 507*   ------------------------------------------------------------------------------------------------------------------  Chemistries  Recent Labs  Lab 10/20/17 1409 10/21/17 0503  NA 136 137  K 4.7 5.2*  CL 106 106  CO2 21* 26  GLUCOSE 87 173*  BUN 25* 17  CREATININE 0.81 0.76  CALCIUM 8.5* 8.3*  AST 19  --   ALT 11*  --   ALKPHOS 75  --   BILITOT 0.4  --    ------------------------------------------------------------------------------------------------------------------  Cardiac Enzymes No results for input(s): TROPONINI in the last 168 hours. ------------------------------------------------------------------------------------------------------------------  RADIOLOGY:  No results found.  EKG:   Orders placed or performed during the hospital encounter of 07/24/17  . ED EKG  . ED EKG  . EKG 12-Lead  . EKG 12-Lead    ASSESSMENT AND PLAN:   1, septic arthritis of the left shoulder: Status post arthroscopic left shoulder I&D by orthopedic yesterday, patient is on vancomycin, Zosyn, seen by Dr. Sampson Goon from ID recommended another 6 weeks course of vancomycin and a PICC line placement, referred for rehab placement but patient refused to go to rehab and wants to go home.  She told me that last  time discharged from rehab without antibiotics that caused her this problem and she does not want to go to rehab at this time.  Sepsis with elevated lactic acid, elevated CRP up to 19.3: Discontinue IV  fluids, continue antibiotics, change her diet to low-sodium diet #2 COPD: No wheezing.  Continue inhalers. 4.  Essential hypertension: Controlled.   All the records are reviewed and case discussed with Care Management/Social Workerr. Management plans discussed with the patient, family and they are in agreement.  CODE STATUS: Full code  TOTAL TIME TAKING CARE OF THIS PATIENT:35 minutes.   POSSIBLE D/C IN 1-2 DAYS, DEPENDING ON CLINICAL CONDITION.   Katha HammingSnehalatha Lewis Grivas M.D on 10/21/2017 at 1:22 PM  Between 7am to 6pm - Pager - [phone]  After 6pm go to [email or web address] - password [email or web address] [facility]  [facility]  Office  [phone]  CC: Primary care physician; Rayetta HumphreyGeorge, Sionne A, MD   Note: This dictation was prepared with Dragon dictation along with smaller phrase technology. Any transcriptional errors that result from this process are unintentional.

## 2017-10-21 NOTE — Progress Notes (Signed)
Initial Nutrition Assessment  DOCUMENTATION CODES:   Not applicable  INTERVENTION:  1. Ensure Enlive po BID, each supplement provides 350 kcal and 20 grams of protein  NUTRITION DIAGNOSIS:   Unintentional weight loss related to acute illness as evidenced by per patient/family report, percent weight loss.  GOAL:   Patient will meet greater than or equal to 90% of their needs  MONITOR:   PO intake, I & O's, Labs, Weight trends, Supplement acceptance  REASON FOR ASSESSMENT:   Malnutrition Screening Tool    ASSESSMENT:   Helen Moore  is a 83 y.o. female with a known history of essential hypertension,, COPD, uncomplicated diabetes mellitus type 2, hypertension came in because of left shoulder pain, swelling, difficulty moving for the past 3-4 weeks, found to have septic arthritis now s/p I&D L shoulder  Spoke with patient at bedside. She reports a UBW of 154 pounds or so. States she has lost 10 pounds over the past month, but has continued to eat well. This is a 6.4% severe weight loss for timeframe. Patient normally eats 2 meals a day, asked for specific details and she states "pigs feet, kidney beans, black eyed peas, collard greens." Seems patient was eating ok. Ate 100% for lunch today of pot roast, orange sherbert, white rice and a sweet potato. Also drinks ensure at home.  Labs reviewed:  K+ 5.2 CBGs 144, 86, 69  Medications reviewed and include:  Colace   NUTRITION - FOCUSED PHYSICAL EXAM:    Most Recent Value  Orbital Region  No depletion  Upper Arm Region  Moderate depletion  Thoracic and Lumbar Region  No depletion  Buccal Region  Mild depletion  Temple Region  No depletion  Clavicle Bone Region  No depletion  Clavicle and Acromion Bone Region  No depletion  Scapular Bone Region  No depletion  Dorsal Hand  No depletion  Patellar Region  No depletion  Anterior Thigh Region  No depletion  Posterior Calf Region  No depletion  Edema (RD Assessment)   Moderate  Hair  Reviewed  Eyes  Reviewed  Mouth  Reviewed  Skin  Reviewed [scaly, dry]  Nails  Reviewed       Diet Order:  Diet Heart Room service appropriate? Yes; Fluid consistency: Thin  EDUCATION NEEDS:   Education needs have been addressed  Skin:  Skin Assessment: Skin Integrity Issues: Skin Integrity Issues:: Other (Comment), Incisions Incisions: to L Shoulder Other: Wound to L Heel  Last BM:  10/19/2017  Height:   Ht Readings from Last 1 Encounters:  10/20/17 5\' 6"  (1.676 m)    Weight:   Wt Readings from Last 1 Encounters:  10/21/17 144 lb 6.4 oz (65.5 kg)    Ideal Body Weight:  59.09 kg  BMI:  Body mass index is 23.31 kg/m.  Estimated Nutritional Needs:   Kcal:  4098-11911614-1937 calories  Protein:  92-105 grams (1.4-1.6g/kg)  Fluid:  1.6-1.9L  Helen AnoWilliam M. Jevonte Clanton, MS, RD LDN Inpatient Clinical Dietitian Pager [phone]

## 2017-10-22 DIAGNOSIS — M00012 Staphylococcal arthritis, left shoulder: Secondary | ICD-10-CM | POA: Diagnosis not present

## 2017-10-22 DIAGNOSIS — M25512 Pain in left shoulder: Secondary | ICD-10-CM | POA: Diagnosis not present

## 2017-10-22 LAB — PREPARE RBC (CROSSMATCH)

## 2017-10-22 LAB — BLOOD CULTURE ID PANEL (REFLEXED)
Acinetobacter baumannii: NOT DETECTED
CANDIDA ALBICANS: NOT DETECTED
CANDIDA GLABRATA: NOT DETECTED
CANDIDA PARAPSILOSIS: NOT DETECTED
CANDIDA TROPICALIS: NOT DETECTED
Candida krusei: NOT DETECTED
Carbapenem resistance: NOT DETECTED
ENTEROBACTER CLOACAE COMPLEX: NOT DETECTED
ENTEROCOCCUS SPECIES: NOT DETECTED
Enterobacteriaceae species: NOT DETECTED
Escherichia coli: NOT DETECTED
Haemophilus influenzae: NOT DETECTED
KLEBSIELLA PNEUMONIAE: NOT DETECTED
Klebsiella oxytoca: NOT DETECTED
Listeria monocytogenes: NOT DETECTED
Methicillin resistance: DETECTED — AB
Neisseria meningitidis: NOT DETECTED
PROTEUS SPECIES: NOT DETECTED
Pseudomonas aeruginosa: NOT DETECTED
SERRATIA MARCESCENS: NOT DETECTED
STREPTOCOCCUS PNEUMONIAE: NOT DETECTED
Staphylococcus aureus (BCID): DETECTED — AB
Staphylococcus species: DETECTED — AB
Streptococcus agalactiae: NOT DETECTED
Streptococcus pyogenes: NOT DETECTED
Streptococcus species: NOT DETECTED
Vancomycin resistance: NOT DETECTED

## 2017-10-22 LAB — HEMOGLOBIN AND HEMATOCRIT, BLOOD
HEMATOCRIT: 27 % — AB (ref 35.0–47.0)
HEMOGLOBIN: 8.6 g/dL — AB (ref 12.0–16.0)

## 2017-10-22 LAB — VANCOMYCIN, TROUGH: Vancomycin Tr: 20 ug/mL (ref 15–20)

## 2017-10-22 LAB — BASIC METABOLIC PANEL
ANION GAP: 11 (ref 5–15)
BUN: 19 mg/dL (ref 6–20)
CHLORIDE: 105 mmol/L (ref 101–111)
CO2: 23 mmol/L (ref 22–32)
CREATININE: 0.88 mg/dL (ref 0.44–1.00)
Calcium: 8.1 mg/dL — ABNORMAL LOW (ref 8.9–10.3)
GFR calc non Af Amer: >60 mL/min (ref >60)
Glucose, Bld: 115 mg/dL — ABNORMAL HIGH (ref 65–99)
POTASSIUM: 5.5 mmol/L — AB (ref 3.5–5.1)
SODIUM: 139 mmol/L (ref 135–145)

## 2017-10-22 LAB — CBC
HEMATOCRIT: 22.9 % — AB (ref 35.0–47.0)
HEMOGLOBIN: 7.3 g/dL — AB (ref 12.0–16.0)
MCH: 26.6 pg (ref 26.0–34.0)
MCHC: 32 g/dL (ref 32.0–36.0)
MCV: 83.1 fL (ref 80.0–100.0)
Platelets: 542 10*3/uL — ABNORMAL HIGH (ref 150–440)
RBC: 2.76 MIL/uL — AB (ref 3.80–5.20)
RDW: 24 % — ABNORMAL HIGH (ref 11.5–14.5)
WBC: 8.7 10*3/uL (ref 3.6–11.0)

## 2017-10-22 LAB — TYPE AND SCREEN
ABO/RH(D): A POS
ANTIBODY SCREEN: NEGATIVE

## 2017-10-22 LAB — ABO/RH: ABO/RH(D): A POS

## 2017-10-22 LAB — GLUCOSE, CAPILLARY: GLUCOSE-CAPILLARY: 90 mg/dL (ref 65–99)

## 2017-10-22 MED ORDER — VANCOMYCIN HCL 10 G IV SOLR
1250.0000 mg | INTRAVENOUS | Status: DC
  Administered 2017-10-22 – 2017-10-24 (×3): 1250 mg via INTRAVENOUS
  Filled 2017-10-22 (×3): qty 1250

## 2017-10-22 MED ORDER — SODIUM POLYSTYRENE SULFONATE 15 GM/60ML PO SUSP
15.0000 g | Freq: Once | ORAL | Status: AC
  Administered 2017-10-22: 15 g via ORAL
  Filled 2017-10-22: qty 60

## 2017-10-22 MED ORDER — SODIUM CHLORIDE 0.9 % IV SOLN: Freq: Once | INTRAVENOUS | Status: DC

## 2017-10-22 NOTE — Care Management Note (Signed)
Case Management Note  Patient Details  Name: Helen CraneGertrude Moore MRN: [id number] Date of Birth: May 14, 1935  Subjective/Objective:   Admitted to [facility] with the diagnosis of septic cellulitis. Lives with sister, Prudy FeelerOra [phone]). Seen Dr. Greggory StallionGeorge last Monday. Prescriptions are [facility] at [facility] [place]. [facility] per [facility] in the past. No skilled nursing. Home oxygen per [facility]. Uses oxygen as needed, Nebulizer, rolling walker, and wheelchair in the home. Takes care of basic needs herself, Sister helps out with needs sometimes. Good appetite. No falls States she has been transported per [facility] in the past.               Action/Plan: Physical therapy evaluation completed. Recommending skilled nursing facility . Declining these services at this time. Wants to go home with Home Health. Wants [facility].  Will need 6 weeks of IVPB's per Infection Disease.   Expected Discharge Date:  10/22/17               Expected Discharge Plan:     In-House Referral:   yes  Discharge planning Services   yes  Post Acute Care Choice:   yes Choice offered to:   patient  DME Arranged:    DME Agency:     HH Arranged:   yes HH Agency:   [facility]  Status of Service:     If discussed at Long Length of Stay Meetings, dates discussed:    Additional Comments:  Gwenette GreetBrenda S Kawon Willcutt, RN MSN CCM Care Management [phone] 10/22/2017, 8:47 AM

## 2017-10-22 NOTE — Progress Notes (Signed)
PT Cancellation Note  Patient Details Name: Helen CraneGertrude Eicher MRN: [id number] DOB: 31-Mar-1935   Cancelled Treatment:    Reason Eval/Treat Not Completed: Medical issues which prohibited therapy.  Pt's potassium noted to be up-trending to 5.5 today.  Per PT guidelines for elevated potassium, will hold PT at this time and re-attempt PT treatment at a later date/time as medically appropriate.  Hendricks LimesEmily Lurene Robley, PT 10/22/17, 4:10 PM [phone]
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
## 2017-10-22 NOTE — Discharge Instructions (Signed)
INSTRUCTIONS AFTER Surgery  o Remove items at home which could result in a fall. This includes throw rugs or furniture in walking pathways o ICE to the affected joint every three hours while awake for 30 minutes at a time, for at least the first 3-5 days, and then as needed for pain and swelling.  Continue to use ice for pain and swelling. You may notice swelling that will progress down to the foot and ankle.  This is normal after surgery.  Elevate your leg when you are not up walking on it.   o Continue to use the breathing machine you got in the hospital (incentive spirometer) which will help keep your temperature down.  It is common for your temperature to cycle up and down following surgery, especially at night when you are not up moving around and exerting yourself.  The breathing machine keeps your lungs expanded and your temperature down.   DIET:  As you were doing prior to hospitalization, we recommend a well-balanced diet.  DRESSING / WOUND CARE / SHOWERING  Keep the surgical dressing until follow up.  The dressing is water proof, so you can shower without any extra covering.  IF THE DRESSING FALLS OFF or the wound gets wet inside, change the dressing with sterile gauze.  Please use good hand washing techniques before changing the dressing.  Do not use any lotions or creams on the incision until instructed by your surgeon.    ACTIVITY  o Increase activity slowly as tolerated, but follow the weight bearing instructions below.   o Use the sling for activities on the left arm. o No lifting or carrying greater than 10 lbs. until further directed by your surgeon. o Avoid periods of inactivity such as sitting longer than an hour when not asleep. This helps prevent blood clots.  o You may return to work once you are authorized by your doctor.     WEIGHT BEARING   Weight-bearing as tolerated.   EXERCISES  Physical therapy will work with range of motion exercises of the left shoulder.  The patient will do gentle range of motion of the elbow and wrist. Left shoulder range of motion with the sling on for protection.   CONSTIPATION  Constipation is defined medically as fewer than three stools per week and severe constipation as less than one stool per week.  Even if you have a regular bowel pattern at home, your normal regimen is likely to be disrupted due to multiple reasons following surgery.  Combination of anesthesia, postoperative narcotics, change in appetite and fluid intake all can affect your bowels.   YOU MUST use at least one of the following options; they are listed in order of increasing strength to get the job done.  They are all available over the counter, and you may need to use some, POSSIBLY even all of these options:    Drink plenty of fluids (prune juice may be helpful) and high fiber foods Colace 100 mg by mouth twice a day  Senokot for constipation as directed and as needed Dulcolax (bisacodyl), take with full glass of water  Miralax (polyethylene glycol) once or twice a day as needed.  If you have tried all these things and are unable to have a bowel movement in the first 3-4 days after surgery call either your surgeon or your primary doctor.    If you experience loose stools or diarrhea, hold the medications until you stool forms back up.  If your symptoms do  not get better within 1 week or if they get worse, check with your doctor.  If you experience "the worst abdominal pain ever" or develop nausea or vomiting, please contact the office immediately for further recommendations for treatment.   ITCHING:  If you experience itching with your medications, try taking only a single pain pill, or even half a pain pill at a time.  You can also use Benadryl over the counter for itching or also to help with sleep.   MEDICATIONS:  See your medication summary on the After Visit Summary that nursing will review with you.  You may have some home medications which  will be placed on hold until you complete the course of blood thinner medication.  It is important for you to complete the blood thinner medication as prescribed.  PRECAUTIONS:  If you experience chest pain or shortness of breath - call 911 immediately for transfer to the hospital emergency department.   If you develop a fever greater that 101 F, purulent drainage from wound, increased redness or drainage from wound, foul odor from the wound/dressing, or calf pain - CONTACT YOUR SURGEON.                                                   FOLLOW-UP APPOINTMENTS:  If you do not already have a post-op appointment, please call the office for an appointment to be seen by your surgeon.  Guidelines for how soon to be seen are listed in your After Visit Summary, but are typically between 1-4 weeks after surgery.  OTHER INSTRUCTIONS:   IV antibiotics will be given for 6 weeks by home health nursing.  MAKE SURE YOU:   Understand these instructions.   Get help right away if you are not doing well or get worse.    Thank you for letting us be a part of your medical care team.  It is a privilege we respect greatly.  We hope these instructions will help you stay on track for a fast and full recovery!

## 2017-10-22 NOTE — Addendum Note (Signed)
Addendum  created 10/22/17 1156 by Stormy Fabianurtis, Ieshia Hatcher, CRNA   Charge Capture section accepted
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
## 2017-10-22 NOTE — Progress Notes (Signed)
PHARMACY - PHYSICIAN COMMUNICATION CRITICAL VALUE ALERT - BLOOD CULTURE IDENTIFICATION (BCID)  Helen CraneGertrude Moore is an 83 y.o. female who presented to [facility] on 10/20/2017 with a chief complaint of septic joint  Assessment:  GPC 1/4 MRSA (include suspected source if known)  Name of physician (or Provider) Contacted: n/a  Current antibiotics: vanc/Zosyn  Changes to prescribed antibiotics recommended:  Abx per ID  No results found for this or any previous visit.  [name] S 10/22/2017  2:03 AM

## 2017-10-22 NOTE — Consult Note (Signed)
Pharmacy Antibiotic Note  Helen CraneGertrude Moore is a 83 y.o. female admitted on 10/20/2017 with  Septic joint.  Pharmacy has been consulted for vancomycin dosing. Patient received vancomycin 1g IV 2/4 @ 1942 (pre-op)   Plan: Vancomycin Trough level resulted @ 20. Patient likely to have to be on therapy as an outpatient per ID notes; therefore will transition patient to q24 hour regimen. Will start Vancomycin 1250 mg IV q24 hours.   Height: 5\' 6"  (167.6 cm) Weight: 147 lb 4.3 oz (66.8 kg) IBW/kg (Calculated) : 59.3  Temp (24hrs), Avg:98.5 F (36.9 C), Min:98.1 F (36.7 C), Max:98.8 F (37.1 C)  Recent Labs  Lab 10/20/17 1409 10/21/17 0503 10/22/17 0951 10/22/17 1318  WBC  --  5.4 8.7  --   CREATININE 0.81 0.76 0.88  --   LATICACIDVEN 1.1 1.3  --   --   VANCOTROUGH  --   --   --  20    Estimated Creatinine Clearance: 47.7 mL/min (by C-G formula based on SCr of 0.88 mg/dL).    Allergies  Allergen Reactions  . Motrin [Ibuprofen]   . Sulfur     Antimicrobials this admission: 2/4 vancomycin >>   Dose adjustments this admission:  Microbiology results: 2/4  BCx: pending  2/4  MRSA PCR: negative   Thank you for allowing pharmacy to be a part of this patient's care.  Demetrius CharityJames,Madysen Faircloth D, PharmD, BCPS Clinical Pharmacist 10/22/2017 2:37 PM

## 2017-10-22 NOTE — Progress Notes (Signed)
Rept to Dr. Luberta MutterKonidena pt's K 5.5 today. Orders received. Will continue to monitor.

## 2017-10-22 NOTE — Progress Notes (Signed)
Rpt Hemoglobin 8.6so we will hold off on blood transfusion for now.

## 2017-10-22 NOTE — Progress Notes (Signed)
OT Cancellation Note  Patient Details Name: Helen CraneGertrude Parco MRN: [id number] DOB: 05/16/1935   Cancelled Treatment:    Reason Eval/Treat Not Completed: Medical issues which prohibited therapy. Chart reviewed. Pt noted with critical potassium value of 5.5. Cut off per therapy protocol is 5.2. Pt received kayexalate at 11:41am. BMP scheduled for tomorrow am. Will continue to follow acutely and re-attempt OT treatment as medical appropriate.  Richrd PrimeJamie Stiller, MPH, MS, OTR/L ascom [phone] 10/22/17, 4:09 PM

## 2017-10-22 NOTE — Care Management Important Message (Signed)
Important Message  Patient Details  Name: Helen Moore MRN: [id number] Date of Birth: [birth year]   Medicare Important Message Given:  Yes    Gwenette GreetBrenda S Isabel Freese, RN 10/22/2017, 6:46 AM

## 2017-10-22 NOTE — Progress Notes (Signed)
Rept to Dr. Luberta MutterKonidena hgb recheck 8.6 from hgb this AM of 7.3. Order received to d/c 1 unit of blood. Will continue to monitor.

## 2017-10-22 NOTE — Progress Notes (Signed)
[facility] [place] at [facility]   PATIENT NAME: Helen Moore    MR#:  [id number]  DATE OF BIRTH:  [birth year]  SUBJECTIVE: Patient denies any complaints, left shoulder pain controlled with pain medicines.  Getting PICC line today, blood cultures are negative.  CHIEF COMPLAINT:  No chief complaint on file.   REVIEW OF SYSTEMS:   ROS  CONSTITUTIONAL: No fever, fatigue or weakness.  EYES: No blurred or double vision.  EARS, NOSE, AND THROAT: No tinnitus or ear pain.  RESPIRATORY: No cough, shortness of breath, wheezing or hemoptysis.  CARDIOVASCULAR: No chest pain, orthopnea, edema.  GASTROINTESTINAL: No nausea, vomiting, diarrhea or abdominal pain.  GENITOURINARY: No dysuria, hematuria.  ENDOCRINE: No polyuria, nocturia,  HEMATOLOGY: No anemia, easy bruising or bleeding SKIN: No rash or lesion. MUSCULOSKELETAL: Left shoulder sling present   NEUROLOGIC: No tingling, numbness, weakness.  PSYCHIATRY: No anxiety or depression.   DRUG ALLERGIES:   Allergies  Allergen Reactions  . Motrin [Ibuprofen]   . Sulfur     VITALS:  Blood pressure (!) 148/68, pulse 80, temperature 98.1 F (36.7 C), temperature source Oral, resp. rate 19, height 5\' 6"  (1.676 m), weight 66.8 kg (147 lb 4.3 oz), SpO2 100 %.  PHYSICAL EXAMINATION:  GENERAL:  83 y.o.-year-old patient lying in the bed with no acute distress.  EYES: Pupils equal, round, reactive to light and accommodation. No scleral icterus. Extraocular muscles intact.  HEENT: Head atraumatic, normocephalic. Oropharynx and nasopharynx clear.  NECK:  Supple, no jugular venous distention. No thyroid enlargement, no tenderness.  LUNGS: Normal breath sounds bilaterally, no wheezing, rales,rhonchi or crepitation. No use of accessory muscles of respiration.  CARDIOVASCULAR: S1, S2 normal. No murmurs, rubs, or gallops.  ABDOMEN: Soft, nontender, nondistended. Bowel sounds present. No organomegaly or mass.   EXTREMITIES: No pedal edema, cyanosis, or clubbing.  She has sling for the left shoulder. NEUROLOGIC: Cranial nerves II through XII are intact. Muscle strength 5/5 in all extremities. Sensation intact. Gait not checked.  PSYCHIATRIC: The patient is alert and oriented x 3.  SKIN: No obvious rash, lesion, or ulcer.    LABORATORY PANEL:   CBC Recent Labs  Lab 10/22/17 0951  WBC 8.7  HGB 7.3*  HCT 22.9*  PLT 542*   ------------------------------------------------------------------------------------------------------------------  Chemistries  Recent Labs  Lab 10/20/17 1409  10/22/17 0951  NA 136   < > 139  K 4.7   < > 5.5*  CL 106   < > 105  CO2 21*   < > 23  GLUCOSE 87   < > 115*  BUN 25*   < > 19  CREATININE 0.81   < > 0.88  CALCIUM 8.5*   < > 8.1*  AST 19  --   --   ALT 11*  --   --   ALKPHOS 75  --   --   BILITOT 0.4  --   --    < > = values in this interval not displayed.   ------------------------------------------------------------------------------------------------------------------  Cardiac Enzymes No results for input(s): TROPONINI in the last 168 hours. ------------------------------------------------------------------------------------------------------------------  RADIOLOGY:  No results found.  EKG:   Orders placed or performed during the hospital encounter of 07/24/17  . ED EKG  . ED EKG  . EKG 12-Lead  . EKG 12-Lead    ASSESSMENT AND PLAN:   1, septic arthritis of the left shoulder: Status post arthroscopic left shoulder I&D by orthopedic yesterday, patient is on vancomycin, Zosyn, seen by Dr. Sampson GoonFitzgerald  from ID recommended another 6 weeks course of vancomycin and a PICC line placement, referred for rehab placement but patient refused to go to rehab and wants to go home.  She told me that last time discharged from rehab without antibiotics that caused her this problem and she does not want to go to rehab at this time. She will need 6 weeks of  IV vancomycin, PICC line consult today, blood cultures are negative. Cultures from the left shoulder showed no organism so far but final culture results pending.  Sepsis with elevated lactic acid, elevated CRP up to 19.3 : #2 COPD: No wheezing.  Continue inhalers. 4.  Essential hypertension: Controlled. [email or web address] . hyperkalemia, patient not on potassium supplements.  small dose of Kayexalate. #6. acute blood loss anemia after surgery patient hemoglobin is 7.3, discussed the risk of blood transfusion benefits with patient, transfuse 1 unit of packed RBC.  All the records are reviewed and case discussed with Care Management/Social Workerr. Management plans discussed with the patient, family and they are in agreement.  CODE STATUS: Full code  TOTAL TIME TAKING CARE OF THIS PATIENT:35 minutes.   POSSIBLE D/C IN 1-2 DAYS, DEPENDING ON CLINICAL CONDITION.   Katha Hamming M.D on 10/22/2017 at 11:23 AM  Between 7am to 6pm - Pager - [phone]  After 6pm go to [email or web address] - password [email or web address] [facility]  [place] [facility]  Office  [phone]  CC: Primary care physician; Helen Humphrey, MD   Note: This dictation was prepared with Dragon dictation along with smaller phrase technology. Any transcriptional errors that result from this process are unintentional.

## 2017-10-23 ENCOUNTER — Inpatient Hospital Stay: Payer: Medicare Other

## 2017-10-23 DIAGNOSIS — M00012 Staphylococcal arthritis, left shoulder: Secondary | ICD-10-CM | POA: Diagnosis not present

## 2017-10-23 DIAGNOSIS — M25512 Pain in left shoulder: Secondary | ICD-10-CM | POA: Diagnosis not present

## 2017-10-23 LAB — C-REACTIVE PROTEIN: CRP: 7.4 mg/dL — ABNORMAL HIGH (ref <1.0)

## 2017-10-23 LAB — BASIC METABOLIC PANEL
ANION GAP: 11 (ref 5–15)
BUN: 16 mg/dL (ref 6–20)
CHLORIDE: 102 mmol/L (ref 101–111)
CO2: 28 mmol/L (ref 22–32)
Calcium: 8.2 mg/dL — ABNORMAL LOW (ref 8.9–10.3)
Creatinine, Ser: 0.64 mg/dL (ref 0.44–1.00)
GFR calc Af Amer: >60 mL/min (ref >60)
Glucose, Bld: 113 mg/dL — ABNORMAL HIGH (ref 65–99)
POTASSIUM: 3.8 mmol/L (ref 3.5–5.1)
Sodium: 141 mmol/L (ref 135–145)

## 2017-10-23 LAB — ACID FAST SMEAR (AFB)
ACID FAST SMEAR - AFSCU2: NEGATIVE
ACID FAST SMEAR - AFSCU2: NEGATIVE

## 2017-10-23 LAB — SEDIMENTATION RATE: Sed Rate: 127 mm/hr — ABNORMAL HIGH (ref 0–30)

## 2017-10-23 LAB — CBC
HCT: 28.5 % — ABNORMAL LOW (ref 35.0–47.0)
HEMOGLOBIN: 8.9 g/dL — AB (ref 12.0–16.0)
MCH: 26.5 pg (ref 26.0–34.0)
MCHC: 31.2 g/dL — ABNORMAL LOW (ref 32.0–36.0)
MCV: 85 fL (ref 80.0–100.0)
Platelets: 594 10*3/uL — ABNORMAL HIGH (ref 150–440)
RBC: 3.35 MIL/uL — AB (ref 3.80–5.20)
RDW: 24.7 % — ABNORMAL HIGH (ref 11.5–14.5)
WBC: 7.9 10*3/uL (ref 3.6–11.0)

## 2017-10-23 LAB — ACID FAST SMEAR (AFB, MYCOBACTERIA)

## 2017-10-23 LAB — GLUCOSE, CAPILLARY: GLUCOSE-CAPILLARY: 82 mg/dL (ref 65–99)

## 2017-10-23 MED ORDER — SODIUM CHLORIDE 0.9% FLUSH: 10.0000 mL | INTRAVENOUS | Status: DC | PRN

## 2017-10-23 MED ORDER — SODIUM CHLORIDE 0.9% FLUSH
10.0000 mL | Freq: Two times a day (BID) | INTRAVENOUS | Status: DC
  Administered 2017-10-24 (×2): 10 mL

## 2017-10-23 NOTE — Plan of Care (Addendum)
Attempted to draw AM labs off midline - midline is not drawing back.  IV consult placed to assist.  IV team assessed and stated unable to fix line to draw lab.  Lab notified to attempt to get labs with stick.  Dr. Also made aware of [id number].

## 2017-10-23 NOTE — Progress Notes (Signed)
OT Cancellation Note  Patient Details Name: Helen Moore MRN: [id number] DOB: 11-12-1934   Cancelled Treatment:    Reason Eval/Treat Not Completed: Medical issues which prohibited therapy. Pt still pending updated K+ values. Will continue to follow acutely for appropriateness of OT treatment.  Richrd PrimeJamie Stiller, MPH, MS, OTR/L ascom [phone] 10/23/17, 10:57 AM

## 2017-10-23 NOTE — Progress Notes (Signed)
Peripherally Inserted Central Catheter/Midline Placement  The IV Nurse has discussed with the patient and/or persons authorized to consent for the patient, the purpose of this procedure and the potential benefits and risks involved with this procedure.  The benefits include less needle sticks, lab draws from the catheter, and the patient may be discharged home with the catheter. Risks include, but not limited to, infection, bleeding, blood clot (thrombus formation), and puncture of an artery; nerve damage and irregular heartbeat and possibility to perform a PICC exchange if needed/ordered by physician.  Alternatives to this procedure were also discussed.  Bard Power PICC patient education guide, fact sheet on infection prevention and patient information card has been provided to patient /or left at bedside.  Consent signed 10/22/17 1400, patient does not have any questions after reviewing procedure, risk and benefits.  PICC/Midline Placement Documentation  PICC Single Lumen 07/27/17 PICC Right Brachial 40 cm 0 cm (Active)     PICC Single Lumen 10/23/17 PICC Right Brachial 36 cm 0 cm (Active)  Indication for Insertion or Continuance of Line Home intravenous therapies (PICC only) 10/23/2017  4:12 PM  Exposed Catheter (cm) 0 cm 10/23/2017  4:12 PM  Site Assessment Clean;Dry;Intact 10/23/2017  4:12 PM  Line Status Flushed;Saline locked;Blood return noted 10/23/2017  4:12 PM  Dressing Type Transparent 10/23/2017  4:12 PM  Dressing Status Clean;Dry;Intact;Antimicrobial disc in place 10/23/2017  4:12 PM  Dressing Change Due 10/30/17 10/23/2017  4:12 PM       Hajer Dwyer, Lajean ManesKerry Loraine 10/23/2017, 4:13 PM

## 2017-10-23 NOTE — Progress Notes (Signed)
[facility] [place] [facility]   PATIENT NAME: Helen CraneGertrude Sadik    MR#:  [id number]  DATE OF BIRTH:  Sep 27, 1934  SUBJECTIVE: Patient is seen at bedside, blood cultures have been negative for 2 days .  Does have left shoulder pain controlled with pain medicine.  CHIEF COMPLAINT:  No chief complaint on file.   REVIEW OF SYSTEMS:   ROS  CONSTITUTIONAL: No fever, fatigue or weakness.  EYES: No blurred or double vision.  EARS, NOSE, AND THROAT: No tinnitus or ear pain.  RESPIRATORY: No cough, shortness of breath, wheezing or hemoptysis.  CARDIOVASCULAR: No chest pain, orthopnea, edema.  GASTROINTESTINAL: No nausea, vomiting, diarrhea or abdominal pain.  GENITOURINARY: No dysuria, hematuria.  ENDOCRINE: No polyuria, nocturia,  HEMATOLOGY: No anemia, easy bruising or bleeding SKIN: No rash or lesion. MUSCULOSKELETAL: Left shoulder sling present   NEUROLOGIC: No tingling, numbness, weakness.  PSYCHIATRY: No anxiety or depression.   DRUG ALLERGIES:   Allergies  Allergen Reactions  . Motrin [Ibuprofen]   . Sulfur     VITALS:  Blood pressure (!) 152/72, pulse 88, temperature 98.7 F (37.1 C), temperature source Oral, resp. rate 16, height 5\' 6"  (1.676 m), weight 67.7 kg (149 lb 4 oz), SpO2 97 %.  PHYSICAL EXAMINATION:  GENERAL:  83 y.o.-year-old patient lying in the bed with no acute distress.  EYES: Pupils equal, round, reactive to light and accommodation. No scleral icterus. Extraocular muscles intact.  HEENT: Head atraumatic, normocephalic. Oropharynx and nasopharynx clear.  NECK:  Supple, no jugular venous distention. No thyroid enlargement, no tenderness.  LUNGS: Normal breath sounds bilaterally, no wheezing, rales,rhonchi or crepitation. No use of accessory muscles of respiration.  CARDIOVASCULAR: S1, S2 normal. No murmurs, rubs, or gallops.  ABDOMEN: Soft, nontender, nondistended. Bowel sounds present. No organomegaly or mass.  EXTREMITIES:  No pedal edema, cyanosis, or clubbing.  She has sling for the left shoulder. NEUROLOGIC: Cranial nerves II through XII are intact. Muscle strength 5/5 in all extremities. Sensation intact. Gait not checked.  PSYCHIATRIC: The patient is alert and oriented x 3.  SKIN: No obvious rash, lesion, or ulcer.    LABORATORY PANEL:   CBC Recent Labs  Lab 10/22/17 0951 10/22/17 1318  WBC 8.7  --   HGB 7.3* 8.6*  HCT 22.9* 27.0*  PLT 542*  --    ------------------------------------------------------------------------------------------------------------------  Chemistries  Recent Labs  Lab 10/20/17 1409  10/22/17 0951  NA 136   < > 139  K 4.7   < > 5.5*  CL 106   < > 105  CO2 21*   < > 23  GLUCOSE 87   < > 115*  BUN 25*   < > 19  CREATININE 0.81   < > 0.88  CALCIUM 8.5*   < > 8.1*  AST 19  --   --   ALT 11*  --   --   ALKPHOS 75  --   --   BILITOT 0.4  --   --    < > = values in this interval not displayed.   ------------------------------------------------------------------------------------------------------------------  Cardiac Enzymes No results for input(s): TROPONINI in the last 168 hours. ------------------------------------------------------------------------------------------------------------------  RADIOLOGY:  No results found.  EKG:   Orders placed or performed during the hospital encounter of 07/24/17  . ED EKG  . ED EKG  . EKG 12-Lead  . EKG 12-Lead    ASSESSMENT AND PLAN:   1, septic arthritis of the left shoulder: Status post arthroscopic left  shoulder I&D by orthopedic yesterday, patient is on vancomycin, Zosyn, seen by Dr. Sampson Goon from ID recommended another 6 weeks course of vancomycin and a PICC line placement, referred for rehab placement but patient refused to go to rehab and wants to go home.  She told me that last time discharged from rehab without antibiotics that caused her this problem and she does not want to go to rehab at this time. She  will need 6 weeks of IV vancomycin, PICC line placement today, blood cultures have been negative for 2 days from 2/5.  Cultures from the left shoulder showed no organism so far but final culture results pending.  Sepsis with elevated lactic acid, elevated CRP up to 19.3  : #2 COPD: No wheezing.  Continue inhalers. 4.  Essential hypertension: Controlled. [email or web address] . hyperkalemia, patient not on potassium supplements.  small dose of Kayexalate.  Given, repeat potassium is pending. #6.  Spurious lab results with low hemoglobin 7.3 improved to 8.6 on repeat check.  Did not require blood transfusion.  All the records are reviewed and case discussed with Care Management/Social Workerr. Management plans discussed with the patient, family and they are in agreement.  CODE STATUS: Full code  TOTAL TIME TAKING CARE OF THIS PATIENT:35 minutes.   POSSIBLE D/C IN 1-2 DAYS, DEPENDING ON CLINICAL CONDITION.   Katha Hamming M.D on 10/23/2017 at 11:01 AM  Between 7am to 6pm - Pager - [phone]  After 6pm go to [email or web address] - password [email or web address]  [place] [facility]  Office  [phone]  CC: Primary care physician; [name], MD   Note: This dictation was prepared with Dragon dictation along with smaller phrase technology. Any transcriptional errors that result from this process are unintentional.
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
## 2017-10-23 NOTE — Progress Notes (Signed)
Infectious Disease Long Term IV Antibiotic Orders Naraya Stoneberg 1935-06-03  Diagnosis: MRSA bacteremia and Recurrent L shoulder septic arthritis.   Culture results Aerobic/Anaerobic Culture (surgical/deep wound) [[id number]]  Collected: 10/20/17 1907  Order Status: Completed Specimen: Tissue from [facility] Other Updated: 10/23/17 1258   Specimen Description --   TISSUE SUBDELTOID  Performed at [facility], [address] [place], [place] [address]    Special Requests --   TISSUE SUBDELTOID  Performed at [facility], [place], [place] [address]    Gram Stain --   MODERATE WBC PRESENT,BOTH PMN AND MONONUCLEAR  NO ORGANISMS SEEN    Culture --   RARE METHICILLIN RESISTANT STAPHYLOCOCCUS AUREUS  CRITICAL RESULT CALLED TO, READ BACK BY AND VERIFIED WITH: Pilar Plate RN ,AT 1256 10/22/17 REGARDING CULTURE GROWTH  Performed at [facility], [place] [address], [place], [place] [address]    Report Status PENDING   Organism ID, Bacteria METHICILLIN RESISTANT STAPHYLOCOCCUS AUREUS  Susceptibility   Methicillin resistant staphylococcus aureus (ZZ00)   Antibiotic Interpretation Microscan Method Status   CIPROFLOXACIN Resistant >=8 RESISTANT MIC Final   ERYTHROMYCIN Resistant >=8 RESISTANT MIC Final   GENTAMICIN Sensitive <=0.5 SENSITIVE MIC Final   OXACILLIN Resistant >=4 RESISTANT MIC Final   TETRACYCLINE Sensitive <=1 SENSITIVE MIC Final   VANCOMYCIN Sensitive 1 SENSITIVE MIC Final   TRIMETH/SULFA Sensitive <=10 SENSITIVE MIC Final   CLINDAMYCIN Sensitive <=0.25 SENSITIVE MIC Final   RIFAMPIN Sensitive <=0.5 SENSITIVE MIC Final   Inducible Clindamycin Sensitive NEGATIVE MIC Final   Susceptibility Comments        LABS Lab Results  Component Value Date   CREATININE 0.88 10/22/2017   Lab Results  Component Value Date   WBC 8.7 10/22/2017   HGB 8.6 (L) 10/22/2017   HCT 27.0 (L) 10/22/2017   MCV 83.1 10/22/2017   PLT 542 (H) 10/22/2017   Lab  Results  Component Value Date   ESRSEDRATE 108 (H) 07/26/2017   Lab Results  Component Value Date   CRP 19.3 (H) 10/21/2017   Allergies:  Allergies  Allergen Reactions  . Motrin [Ibuprofen]   . Sulfur     Discharge antibiotics Vancomycin        1250          mg  every       24        hours .     Goal vancomycin trough 15-20.    Pharmacy to adjust dosing based on levels  PICC Care per protocol Labs weekly while on IV antibiotics -FAX weekly labs to [phone] CBC w diff   Comprehensive met panel Vancomycin Trough   CRP   Planned duration of antibiotics 6 weeks   Stop date March 19th   Follow up clinic date 4 weeks   Leonel Ramsay, MD

## 2017-10-23 NOTE — Progress Notes (Signed)
PT Cancellation Note  Patient Details Name: Helen CraneGertrude Bernardini MRN: [id number] DOB: 06-25-1935   Cancelled Treatment:    Reason Eval/Treat Not Completed: Patient at procedure or test/unavailable; Pt reports that staff is in route for PICC line placement and requested PT hold this date, nursing confirmed.  Will attempt to see pt at a future date as medically appropriate.     Ovidio Hanger. Scott Fern Canova PT, DPT 10/23/17, 3:09 PM

## 2017-10-24 ENCOUNTER — Inpatient Hospital Stay: Payer: Medicare Other

## 2017-10-24 DIAGNOSIS — M25512 Pain in left shoulder: Secondary | ICD-10-CM | POA: Diagnosis not present

## 2017-10-24 DIAGNOSIS — M00012 Staphylococcal arthritis, left shoulder: Secondary | ICD-10-CM | POA: Diagnosis not present

## 2017-10-24 LAB — ACID FAST SMEAR (AFB, MYCOBACTERIA): Acid Fast Smear: NEGATIVE

## 2017-10-24 LAB — GLUCOSE, CAPILLARY
Glucose-Capillary: 96 mg/dL (ref 65–99)
Glucose-Capillary: 154 mg/dL — ABNORMAL HIGH (ref 65–99)

## 2017-10-24 LAB — ACID FAST SMEAR (AFB)

## 2017-10-24 MED ORDER — VANCOMYCIN IV (FOR PTA / DISCHARGE USE ONLY): 1250.0000 mg | INTRAVENOUS | 0 refills | Status: AC

## 2017-10-24 MED ORDER — ENSURE ENLIVE PO LIQD: 237.0000 mL | Freq: Two times a day (BID) | ORAL | 12 refills | Status: AC

## 2017-10-24 MED ORDER — HEPARIN SOD (PORK) LOCK FLUSH 100 UNIT/ML IV SOLN
500.0000 [IU] | Freq: Once | INTRAVENOUS | Status: AC
  Administered 2017-10-24: 500 [IU] via INTRAVENOUS
  Filled 2017-10-24: qty 5

## 2017-10-24 NOTE — Progress Notes (Signed)
PT Cancellation Note  Patient Details Name: Helen CraneGertrude Moore MRN: [id number] DOB: 1935/07/02   Cancelled Treatment:    Reason Eval/Treat Not Completed: Patient declined PT services this date secondary to wishing to conserve energy for discharge home this afternoon.  Will attempt to see pt at a future date if discharge is delayed.   Ovidio Hanger. Scott Tremain Rucinski PT, DPT 10/24/17, 3:41 PM

## 2017-10-24 NOTE — Progress Notes (Signed)
Patient complains of abdominal pain, cough but no hypoxia, able to tolerate food.  Patient abdomen is soft nontender.  Will do x-ray of abdomen/chest before discharge just to make sure there is no obstruction or ileus/pneumonia. Discharge instructions are in the computer

## 2017-10-24 NOTE — Plan of Care (Signed)
  Education: Knowledge of General Education information will improve 10/24/2017 0433 - Progressing by Rayland Hamed, Genelle GatherMatthew Scott, RN   Health Behavior/Discharge Planning: Ability to manage health-related needs will improve 10/24/2017 0433 - Progressing by Abu Heavin, Genelle GatherMatthew Scott, RN   Clinical Measurements: Ability to maintain clinical measurements within normal limits will improve 10/24/2017 0433 - Progressing by Shawnte Demarest, Genelle GatherMatthew Scott, RN Will remain free from infection 10/24/2017 0433 - Progressing by Sicilia Killough, Genelle GatherMatthew Scott, RN Diagnostic test results will improve 10/24/2017 [phone] - Progressing by Raymona Boss, Genelle GatherMatthew Scott, RN Respiratory complications will improve 10/24/2017 0433 - Progressing by Roxana HiresPage, Zyire Eidson Scott, RN Cardiovascular complication will be avoided 10/24/2017 0433 - Progressing by Avel Ogawa, Genelle GatherMatthew Scott, RN   Clinical Measurements: Diagnostic test results will improve 10/24/2017 0433 - Progressing by Roxana HiresPage, Dragon Thrush Scott, RN   Clinical Measurements: Cardiovascular complication will be avoided 10/24/2017 0433 - Progressing by Roxana HiresPage, Malikiah Debarr Scott, RN   Clinical Measurements: Respiratory complications will improve 10/24/2017 0433 - Progressing by Chukwudi Ewen, Genelle GatherMatthew Scott, RN

## 2017-10-24 NOTE — Progress Notes (Signed)
PHARMACY CONSULT NOTE FOR:  OUTPATIENT  PARENTERAL ANTIBIOTIC THERAPY (OPAT)  Indication: MRSA Bacteremia/ septic arthritis Regimen: Vancomycin 1250 mg IV q24 hours End date: 3/19  IV antibiotic discharge orders are pended. To discharging provider:  please sign these orders via discharge navigator,  Select New Orders & click on the button choice - Manage This Unsigned Work.     Thank you for allowing pharmacy to be a part of this patient's care.  [name] D 10/24/2017, 10:25 AM

## 2017-10-24 NOTE — Discharge Summary (Signed)
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   Intake/Output Summary (Last 24 hours) at 10/24/2017 1148 Last data filed at 10/24/2017 0400 Gross per 24 hour  Intake 890 ml  Output 600 ml  Net 290 ml    Exam Awake Alert, Oriented x 3, No new F.N deficits, Normal affect [place].AT,PERRAL Supple Neck,No JVD, No cervical lymphadenopathy appriciated.  Symmetrical Chest wall movement, Good air movement bilaterally, CTAB RRR,No Gallops,Rubs or new Murmurs, No Parasternal Heave +ve B.Sounds, Abd Soft, Non tender, No organomegaly appriciated, No rebound -guarding or rigidity. No Cyanosis, Clubbing or edema, No new Rash or bruise  Data Review   CBC w Diff:  Lab Results  Component Value Date   WBC 7.9 10/23/2017   HGB 8.9 (L) 10/23/2017   HGB 12.2 11/03/2012   HCT 28.5 (L) 10/23/2017   HCT 39.1 11/03/2012   PLT 594 (H) 10/23/2017   PLT 234 11/03/2012   LYMPHOPCT 6 05/04/2017   LYMPHOPCT 27.6 11/03/2012   MONOPCT 11 05/04/2017   MONOPCT 11.3 11/03/2012   EOSPCT 0 05/04/2017   EOSPCT 1.1 11/03/2012   BASOPCT 0 05/04/2017   BASOPCT 1.1 11/03/2012    CMP:  Lab Results  Component Value Date   NA 141 10/23/2017   NA 140 05/17/2014   K 3.8 10/23/2017   K 4.2 05/17/2014   CL 102 10/23/2017   CL 106 05/17/2014   CO2 28 10/23/2017   CO2 23 05/17/2014   BUN 16 10/23/2017   BUN 20 (H) 05/17/2014   CREATININE 0.64 10/23/2017   CREATININE 1.29 (H) 07/12/2016   PROT 7.1 10/20/2017   PROT 7.9 05/17/2014   ALBUMIN 2.4 (L) 10/20/2017   ALBUMIN  3.6 05/17/2014   BILITOT 0.4 10/20/2017   BILITOT 0.2 05/17/2014   ALKPHOS 75 10/20/2017   ALKPHOS 68 05/17/2014   AST 19 10/20/2017   AST 24 05/17/2014   ALT 11 (L) 10/20/2017   ALT 22 05/17/2014  .   Total Time in preparing paper work, data evaluation and todays exam - 35 minutes  Epifanio Lesches M.D on 10/24/2017 at 11:48 AM    Note: This dictation was prepared with Dragon  dictation along with smaller phrase technology. Any transcriptional errors that result from this process are unintentional.

## 2017-10-24 NOTE — Care Management (Signed)
Spoke with Dr. Luberta MutterKonidena. States that Helen Moore will be discharging home today. Telephone call to Helen Moore. Discussed that Helen Moore will be transported home this afternoon after Vancomycin is administered. [facility] will be coming to the home tomorrow to show her how to administer Vancomycin Will update Helen Moore, Helen Tavano [name] RN MSN CCM Care Management [phone]

## 2017-10-24 NOTE — Discharge Planning (Signed)
Patient's sister notified that patient is finishing last dose of IV antibiotics and then we will call EMS to transport. At the earliest, patient will be home between 6-7pm. I also agreed to contact sister Prudy Feeler(Ora) to inform of EMS arrival to the hospital.
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
## 2017-10-24 NOTE — Consult Note (Signed)
Pharmacy Antibiotic Note  Helen Moore is a 83 y.o. female admitted on 10/20/2017 with  Septic joint.  Pharmacy has been consulted for vancomycin dosing. Patient received vancomycin 1g IV 2/4 @ 1942 (pre-op)   Plan: Will continue Vancomycin 1250 mg IV q24 hours.  Height: 5\' 6"  (167.6 cm) Weight: 150 lb 5.7 oz (68.2 kg) IBW/kg (Calculated) : 59.3  Temp (24hrs), Avg:98.5 F (36.9 C), Min:98.5 F (36.9 C), Max:98.5 F (36.9 C)  Recent Labs  Lab 10/20/17 1409 10/21/17 0503 10/22/17 0951 10/22/17 1318 10/23/17 1318  WBC  --  5.4 8.7  --  7.9  CREATININE 0.81 0.76 0.88  --  0.64  LATICACIDVEN 1.1 1.3  --   --   --   VANCOTROUGH  --   --   --  20  --     Estimated Creatinine Clearance: 52.5 mL/min (by C-G formula based on SCr of 0.64 mg/dL).    Allergies  Allergen Reactions  . Motrin [Ibuprofen]   . Sulfur     Antimicrobials this admission: 2/4 vancomycin >>   Dose adjustments this admission:  Microbiology results: BCXs: MRSA   Thank you for allowing pharmacy to be a part of this patient's care.  Demetrius CharityJames,Somaya Grassi D, PharmD, BCPS Clinical Pharmacist 10/24/2017 10:26 AM

## 2017-10-25 LAB — CULTURE, BLOOD (ROUTINE X 2): Special Requests: ADEQUATE

## 2017-10-26 LAB — AEROBIC/ANAEROBIC CULTURE W GRAM STAIN (SURGICAL/DEEP WOUND)

## 2017-10-26 LAB — CULTURE, BLOOD (ROUTINE X 2)
Culture: NO GROWTH
SPECIAL REQUESTS: ADEQUATE

## 2017-10-26 LAB — AEROBIC/ANAEROBIC CULTURE (SURGICAL/DEEP WOUND)

## 2017-10-28 LAB — AEROBIC/ANAEROBIC CULTURE W GRAM STAIN (SURGICAL/DEEP WOUND)

## 2017-10-28 LAB — AEROBIC/ANAEROBIC CULTURE (SURGICAL/DEEP WOUND)

## 2017-11-17 ENCOUNTER — Ambulatory Visit: Payer: Medicare Other | Admitting: Family

## 2017-11-17 ENCOUNTER — Telehealth: Payer: Self-pay | Admitting: Family

## 2017-11-17 NOTE — Telephone Encounter (Signed)
Patient did not show for her [facility] appointment on 11/17/17. Will attempt to reschedule.  

## 2017-11-19 LAB — FUNGUS CULTURE WITH STAIN

## 2017-11-19 LAB — FUNGAL ORGANISM REFLEX

## 2017-11-19 LAB — FUNGUS CULTURE RESULT

## 2017-12-05 LAB — ACID FAST CULTURE WITH REFLEXED SENSITIVITIES (MYCOBACTERIA)
Acid Fast Culture: NEGATIVE
Acid Fast Culture: NEGATIVE

## 2017-12-06 LAB — ACID FAST CULTURE WITH REFLEXED SENSITIVITIES (MYCOBACTERIA): Acid Fast Culture: NEGATIVE

## 2018-05-08 ENCOUNTER — Emergency Department: Payer: Medicare Other

## 2018-05-08 ENCOUNTER — Encounter: Payer: Self-pay | Admitting: Emergency Medicine

## 2018-05-08 ENCOUNTER — Inpatient Hospital Stay
Admission: EM | Admit: 2018-05-08 | Discharge: 2018-05-17 | DRG: 560 | Disposition: E | Payer: Medicare Other | Attending: Internal Medicine | Admitting: Internal Medicine

## 2018-05-08 DIAGNOSIS — N179 Acute kidney failure, unspecified: Secondary | ICD-10-CM | POA: Diagnosis present

## 2018-05-08 DIAGNOSIS — Z8349 Family history of other endocrine, nutritional and metabolic diseases: Secondary | ICD-10-CM | POA: Diagnosis not present

## 2018-05-08 DIAGNOSIS — Z8619 Personal history of other infectious and parasitic diseases: Secondary | ICD-10-CM | POA: Diagnosis not present

## 2018-05-08 DIAGNOSIS — J449 Chronic obstructive pulmonary disease, unspecified: Secondary | ICD-10-CM | POA: Diagnosis present

## 2018-05-08 DIAGNOSIS — I110 Hypertensive heart disease with heart failure: Secondary | ICD-10-CM | POA: Diagnosis present

## 2018-05-08 DIAGNOSIS — Z90710 Acquired absence of both cervix and uterus: Secondary | ICD-10-CM | POA: Diagnosis not present

## 2018-05-08 DIAGNOSIS — M71161 Other infective bursitis, right knee: Secondary | ICD-10-CM

## 2018-05-08 DIAGNOSIS — I5042 Chronic combined systolic (congestive) and diastolic (congestive) heart failure: Secondary | ICD-10-CM | POA: Diagnosis present

## 2018-05-08 DIAGNOSIS — E785 Hyperlipidemia, unspecified: Secondary | ICD-10-CM | POA: Diagnosis present

## 2018-05-08 DIAGNOSIS — D649 Anemia, unspecified: Secondary | ICD-10-CM | POA: Diagnosis present

## 2018-05-08 DIAGNOSIS — Z87891 Personal history of nicotine dependence: Secondary | ICD-10-CM

## 2018-05-08 DIAGNOSIS — Z66 Do not resuscitate: Secondary | ICD-10-CM | POA: Diagnosis present

## 2018-05-08 DIAGNOSIS — Z886 Allergy status to analgesic agent status: Secondary | ICD-10-CM | POA: Diagnosis not present

## 2018-05-08 DIAGNOSIS — I2510 Atherosclerotic heart disease of native coronary artery without angina pectoris: Secondary | ICD-10-CM | POA: Diagnosis present

## 2018-05-08 DIAGNOSIS — T8453XA Infection and inflammatory reaction due to internal right knee prosthesis, initial encounter: Secondary | ICD-10-CM | POA: Diagnosis present

## 2018-05-08 DIAGNOSIS — Z882 Allergy status to sulfonamides status: Secondary | ICD-10-CM

## 2018-05-08 DIAGNOSIS — M00061 Staphylococcal arthritis, right knee: Secondary | ICD-10-CM | POA: Diagnosis present

## 2018-05-08 DIAGNOSIS — B9689 Other specified bacterial agents as the cause of diseases classified elsewhere: Secondary | ICD-10-CM | POA: Diagnosis present

## 2018-05-08 DIAGNOSIS — Z7982 Long term (current) use of aspirin: Secondary | ICD-10-CM

## 2018-05-08 DIAGNOSIS — M25561 Pain in right knee: Secondary | ICD-10-CM | POA: Diagnosis present

## 2018-05-08 DIAGNOSIS — I469 Cardiac arrest, cause unspecified: Secondary | ICD-10-CM | POA: Diagnosis not present

## 2018-05-08 DIAGNOSIS — Z7951 Long term (current) use of inhaled steroids: Secondary | ICD-10-CM

## 2018-05-08 DIAGNOSIS — M009 Pyogenic arthritis, unspecified: Secondary | ICD-10-CM | POA: Diagnosis present

## 2018-05-08 DIAGNOSIS — Z8261 Family history of arthritis: Secondary | ICD-10-CM | POA: Diagnosis not present

## 2018-05-08 DIAGNOSIS — Z96651 Presence of right artificial knee joint: Secondary | ICD-10-CM | POA: Diagnosis present

## 2018-05-08 DIAGNOSIS — R001 Bradycardia, unspecified: Secondary | ICD-10-CM | POA: Diagnosis not present

## 2018-05-08 DIAGNOSIS — Z79899 Other long term (current) drug therapy: Secondary | ICD-10-CM

## 2018-05-08 DIAGNOSIS — M1712 Unilateral primary osteoarthritis, left knee: Secondary | ICD-10-CM | POA: Diagnosis present

## 2018-05-08 DIAGNOSIS — Y838 Other surgical procedures as the cause of abnormal reaction of the patient, or of later complication, without mention of misadventure at the time of the procedure: Secondary | ICD-10-CM | POA: Diagnosis present

## 2018-05-08 DIAGNOSIS — E875 Hyperkalemia: Secondary | ICD-10-CM | POA: Diagnosis present

## 2018-05-08 DIAGNOSIS — Z8249 Family history of ischemic heart disease and other diseases of the circulatory system: Secondary | ICD-10-CM

## 2018-05-08 DIAGNOSIS — B9562 Methicillin resistant Staphylococcus aureus infection as the cause of diseases classified elsewhere: Secondary | ICD-10-CM | POA: Diagnosis present

## 2018-05-08 DIAGNOSIS — Z7984 Long term (current) use of oral hypoglycemic drugs: Secondary | ICD-10-CM

## 2018-05-08 LAB — COMPREHENSIVE METABOLIC PANEL
ALT: 9 U/L (ref 0–44)
ANION GAP: 9 (ref 5–15)
AST: 20 U/L (ref 15–41)
Albumin: 2.3 g/dL — ABNORMAL LOW (ref 3.5–5.0)
Alkaline Phosphatase: 51 U/L (ref 38–126)
BUN: 44 mg/dL — ABNORMAL HIGH (ref 8–23)
CO2: 25 mmol/L (ref 22–32)
Calcium: 8.4 mg/dL — ABNORMAL LOW (ref 8.9–10.3)
Chloride: 111 mmol/L (ref 98–111)
Creatinine, Ser: 1.27 mg/dL — ABNORMAL HIGH (ref 0.44–1.00)
GFR calc Af Amer: 45 mL/min — ABNORMAL LOW (ref >60)
GFR, EST NON AFRICAN AMERICAN: 38 mL/min — AB (ref >60)
Glucose, Bld: 72 mg/dL (ref 70–99)
POTASSIUM: 6.7 mmol/L — AB (ref 3.5–5.1)
SODIUM: 145 mmol/L (ref 135–145)
Total Bilirubin: 0.6 mg/dL (ref 0.3–1.2)
Total Protein: 7.2 g/dL (ref 6.5–8.1)

## 2018-05-08 LAB — SYNOVIAL CELL COUNT + DIFF, W/ CRYSTALS
Crystals, Fluid: NONE SEEN
EOSINOPHILS-SYNOVIAL: 0 %
Lymphocytes-Synovial Fld: 0 %
MONOCYTE-MACROPHAGE-SYNOVIAL FLUID: 1 %
Neutrophil, Synovial: 99 %
Other Cells-SYN: 0
WBC, SYNOVIAL: 54734 /mm3 — AB (ref 0–200)

## 2018-05-08 LAB — CBC
HCT: 27.7 % — ABNORMAL LOW (ref 35.0–47.0)
HEMOGLOBIN: 8.5 g/dL — AB (ref 12.0–16.0)
MCH: 24.4 pg — AB (ref 26.0–34.0)
MCHC: 30.6 g/dL — AB (ref 32.0–36.0)
MCV: 79.6 fL — ABNORMAL LOW (ref 80.0–100.0)
PLATELETS: 403 10*3/uL (ref 150–440)
RBC: 3.48 MIL/uL — ABNORMAL LOW (ref 3.80–5.20)
RDW: 21.7 % — AB (ref 11.5–14.5)
WBC: 8.6 10*3/uL (ref 3.6–11.0)

## 2018-05-08 LAB — SEDIMENTATION RATE: SED RATE: 70 mm/h — AB (ref 0–30)

## 2018-05-08 MED ORDER — SODIUM CHLORIDE 0.9 % IV SOLN
1.0000 g | Freq: Once | INTRAVENOUS | Status: AC
  Administered 2018-05-08: 1 g via INTRAVENOUS
  Filled 2018-05-08: qty 10

## 2018-05-08 MED ORDER — MORPHINE SULFATE (PF) 2 MG/ML IV SOLN: 2.0000 mg | INTRAVENOUS | Status: DC | PRN

## 2018-05-08 MED ORDER — SODIUM CHLORIDE 0.9 % IV BOLUS
1000.0000 mL | Freq: Once | INTRAVENOUS | Status: AC
  Administered 2018-05-08: 1000 mL via INTRAVENOUS

## 2018-05-08 MED ORDER — ACETAMINOPHEN 325 MG PO TABS: 650.0000 mg | ORAL_TABLET | Freq: Four times a day (QID) | ORAL | Status: DC | PRN

## 2018-05-08 MED ORDER — VANCOMYCIN HCL 10 G IV SOLR
2000.0000 mg | Freq: Once | INTRAVENOUS | Status: DC
  Administered 2018-05-08: 2000 mg via INTRAVENOUS
  Filled 2018-05-08: qty 2000

## 2018-05-08 MED ORDER — DOCUSATE SODIUM 100 MG PO CAPS
100.0000 mg | ORAL_CAPSULE | Freq: Two times a day (BID) | ORAL | Status: DC
  Administered 2018-05-08: 100 mg via ORAL
  Filled 2018-05-08: qty 1

## 2018-05-08 MED ORDER — VANCOMYCIN HCL 10 G IV SOLR
1500.0000 mg | INTRAVENOUS | Status: AC
  Administered 2018-05-08: 1500 mg via INTRAVENOUS
  Filled 2018-05-08: qty 1500

## 2018-05-08 MED ORDER — LIDOCAINE HCL (PF) 1 % IJ SOLN
30.0000 mL | Freq: Once | INTRAMUSCULAR | Status: AC
  Administered 2018-05-08: 30 mL via INTRADERMAL

## 2018-05-08 MED ORDER — VANCOMYCIN HCL 10 G IV SOLR
1500.0000 mg | INTRAVENOUS | Status: DC
  Filled 2018-05-08: qty 1500

## 2018-05-08 MED ORDER — VANCOMYCIN HCL IN DEXTROSE 1-5 GM/200ML-% IV SOLN
INTRAVENOUS | Status: AC
  Filled 2018-05-08: qty 200

## 2018-05-08 MED ORDER — TIOTROPIUM BROMIDE MONOHYDRATE 18 MCG IN CAPS
18.0000 ug | ORAL_CAPSULE | Freq: Every day | RESPIRATORY_TRACT | Status: DC
  Administered 2018-05-08: 18 ug via RESPIRATORY_TRACT
  Filled 2018-05-08: qty 5

## 2018-05-08 MED ORDER — ADULT MULTIVITAMIN W/MINERALS CH
1.0000 | ORAL_TABLET | Freq: Every day | ORAL | Status: DC
  Administered 2018-05-08: 1 via ORAL
  Filled 2018-05-08: qty 1

## 2018-05-08 MED ORDER — IPRATROPIUM-ALBUTEROL 0.5-2.5 (3) MG/3ML IN SOLN: 3.0000 mL | Freq: Four times a day (QID) | RESPIRATORY_TRACT | Status: DC | PRN

## 2018-05-08 MED ORDER — BUDESONIDE 0.5 MG/2ML IN SUSP
0.5000 mg | Freq: Two times a day (BID) | RESPIRATORY_TRACT | Status: DC
  Administered 2018-05-08: 0.5 mg via RESPIRATORY_TRACT
  Filled 2018-05-08: qty 2

## 2018-05-08 MED ORDER — FUROSEMIDE 10 MG/ML IJ SOLN
20.0000 mg | Freq: Once | INTRAMUSCULAR | Status: AC
  Administered 2018-05-08: 20 mg via INTRAVENOUS
  Filled 2018-05-08: qty 4

## 2018-05-08 MED ORDER — POLYETHYLENE GLYCOL 3350 17 G PO PACK: 17.0000 g | PACK | Freq: Every day | ORAL | Status: DC | PRN

## 2018-05-08 MED ORDER — PIPERACILLIN-TAZOBACTAM 3.375 G IVPB
3.3750 g | Freq: Three times a day (TID) | INTRAVENOUS | Status: DC
  Administered 2018-05-08: 3.375 g via INTRAVENOUS
  Filled 2018-05-08: qty 50

## 2018-05-08 MED ORDER — ENSURE ENLIVE PO LIQD: 237.0000 mL | Freq: Two times a day (BID) | ORAL | Status: DC

## 2018-05-08 MED ORDER — ONDANSETRON HCL 4 MG PO TABS: 4.0000 mg | ORAL_TABLET | Freq: Four times a day (QID) | ORAL | Status: DC | PRN

## 2018-05-08 MED ORDER — ENOXAPARIN SODIUM 40 MG/0.4ML ~~LOC~~ SOLN
40.0000 mg | SUBCUTANEOUS | Status: DC
  Administered 2018-05-08: 40 mg via SUBCUTANEOUS
  Filled 2018-05-08: qty 0.4

## 2018-05-08 MED ORDER — LIDOCAINE HCL (PF) 1 % IJ SOLN
INTRAMUSCULAR | Status: AC
  Administered 2018-05-08: 30 mL via INTRADERMAL
  Filled 2018-05-08: qty 20

## 2018-05-08 MED ORDER — ONDANSETRON HCL 4 MG/2ML IJ SOLN: 4.0000 mg | Freq: Four times a day (QID) | INTRAMUSCULAR | Status: DC | PRN

## 2018-05-08 MED ORDER — DEXTROSE 50 % IV SOLN
25.0000 g | Freq: Once | INTRAVENOUS | Status: AC
  Administered 2018-05-08: 25 g via INTRAVENOUS
  Filled 2018-05-08: qty 50

## 2018-05-08 MED ORDER — ATORVASTATIN CALCIUM 10 MG PO TABS
10.0000 mg | ORAL_TABLET | Freq: Every day | ORAL | Status: DC
  Administered 2018-05-08: 10 mg via ORAL
  Filled 2018-05-08: qty 1

## 2018-05-08 MED ORDER — VANCOMYCIN HCL IN DEXTROSE 750-5 MG/150ML-% IV SOLN: 750.0000 mg | INTRAVENOUS | Status: DC

## 2018-05-08 MED ORDER — ACETAMINOPHEN 650 MG RE SUPP: 650.0000 mg | Freq: Four times a day (QID) | RECTAL | Status: DC | PRN

## 2018-05-08 MED ORDER — INSULIN REGULAR HUMAN 100 UNIT/ML IJ SOLN
10.0000 [IU] | Freq: Once | INTRAMUSCULAR | Status: AC
  Administered 2018-05-08: 10 [IU] via INTRAVENOUS
  Filled 2018-05-08: qty 0.1

## 2018-05-08 MED ORDER — POTASSIUM CHLORIDE CRYS ER 10 MEQ PO TBCR: 10.0000 meq | EXTENDED_RELEASE_TABLET | Freq: Two times a day (BID) | ORAL | Status: DC

## 2018-05-08 MED ORDER — FUROSEMIDE 20 MG PO TABS: 20.0000 mg | ORAL_TABLET | Freq: Every day | ORAL | Status: DC

## 2018-05-08 MED ORDER — PIPERACILLIN-TAZOBACTAM 3.375 G IVPB 30 MIN: 3.3750 g | Freq: Four times a day (QID) | INTRAVENOUS | Status: DC

## 2018-05-08 MED ORDER — HYDROCODONE-ACETAMINOPHEN 5-325 MG PO TABS: 1.0000 | ORAL_TABLET | ORAL | Status: DC | PRN

## 2018-05-08 MED ORDER — AMLODIPINE BESYLATE 10 MG PO TABS
10.0000 mg | ORAL_TABLET | Freq: Every day | ORAL | Status: DC
  Administered 2018-05-08: 10 mg via ORAL
  Filled 2018-05-08: qty 1

## 2018-05-08 MED ORDER — FENTANYL CITRATE (PF) 100 MCG/2ML IJ SOLN
50.0000 ug | Freq: Once | INTRAMUSCULAR | Status: AC
  Administered 2018-05-08: 50 ug via INTRAVENOUS
  Filled 2018-05-08: qty 2

## 2018-05-08 MED ORDER — METOPROLOL SUCCINATE ER 50 MG PO TB24
50.0000 mg | ORAL_TABLET | Freq: Two times a day (BID) | ORAL | Status: DC
  Administered 2018-05-08: 50 mg via ORAL
  Filled 2018-05-08: qty 1

## 2018-05-08 NOTE — Progress Notes (Signed)
Pharmacy Antibiotic Note  Gershon CraneGertrude Hilburn is a 83 y.o. female admitted on 01-13-2018 with right prosthetic knee infection.  Pharmacy has been consulted for vancomycin/Zosyn dosing. Per ED RN, vancomycin 2 g dose NOT given. He scanned the bag but did not hang the 2 g dose. Vanc 2 g dose was returned to pharmacy and the 1500 mg dose was sent up to 1A with the pt.    Plan: Zosyn 3.375g IV q8h (4 hour infusion).   Vancomycin 1500 mg IV x1 (dose reduced from 2000 mg IV x1 - Dr. Sharma CovertNorman wanted 25-30 mg/kg dose initially) Will continue dosing with vancomycin 750 mg IV q24h without stacked dosing.   Ke 0.031, half life 22.4, Vd 48 L - goal trough 15-20 mcg/ml Will need to monitor renal function closely - BMET ordered for AM. Will need to ensure that first two doses are 24h apart as well.    Height: 5\' 6"  (167.6 cm) Weight: 150 lb 5.7 oz (68.2 kg) IBW/kg (Calculated) : 59.3  Temp (24hrs), Avg:98.4 F (36.9 C), Min:98.4 F (36.9 C), Max:98.4 F (36.9 C)  Recent Labs  Lab Jan 24, 2018 1838 Jan 24, 2018 1944  WBC 8.6  --   CREATININE  --  1.27*    Estimated Creatinine Clearance: 32.5 mL/min (A) (by C-G formula based on SCr of 1.27 mg/dL (H)).    Allergies  Allergen Reactions  . Motrin [Ibuprofen]   . Sulfur     Antimicrobials this admission: Ceftriaxone x1 8/23 Zosyn 8/23>> Vancomycin 8/23 >>  Dose adjustments this admission:   Microbiology results: 8/23 BCx: collected 8/23 body fluid culture   Thank you for allowing pharmacy to be a part of this patient's care.  Marty HeckWang, Lilyonna Steidle L 01-13-2018 8:30 PM
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
## 2018-05-08 NOTE — Progress Notes (Signed)
[place] about [place]'s Vancomycin dose. Per ED documentation, pt has received a loading dose of 2gms of Vancomycin when pt was in the ED. However, per Barbara CowerJason, pt did not receive 2gms of Vancomycin since this dose was never issued in the ED. Dr. [name] made aware.

## 2018-05-08 NOTE — Progress Notes (Signed)
CRITICAL VALUE ALERT  Critical value received:  Potassium - 6.7  Date of notification:  05/04/2018  Time of notification:  2020  Critical value read back:Yes.    Nurse who received alert:  Henrene DodgeAnessa M., RN  MD notified (1st page):  Dr. [name]  Time of first page:  2059  MD notified (2nd page):  Time of second page:  Responding MD:  Dr. [name] with new orders made  Time MD responded:  2105

## 2018-05-08 NOTE — ED Notes (Signed)
12 unsuccessful blood drawl attemptss. 2 by this RN, 2 by Shanda BumpsJessica, 2 by Barbette HairNoel RN, 2 by lab, 2 by medic student, 1 by MD Sharma CovertNorman, and 1 by MD Rifenbark. Only 1 culture collected.
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
## 2018-05-08 NOTE — Progress Notes (Signed)
Family Meeting Note  Advance Directive:yes  Today a meeting took place with the Patient.  Patient is able to participate   The following clinical team members were present during this meeting:MD  The following were discussed:Patient's diagnosis: Coronary artery disease, heart failure, COPD, hypertension, hyperlipidemia, septic right knee, Patient's progosis: Unable to determine and Goals for treatment: DNR  Additional follow-up to be provided: prn  Time spent during discussion:20 minutes  Bertrum SolMontell D Trev Boley, MD

## 2018-05-08 NOTE — ED Notes (Signed)
Pharmacy contacted for vancomycin. Pharmacy states they are talking to the MD over it

## 2018-05-08 NOTE — ED Triage Notes (Signed)
Pt arrived via ems from home with complaints of lower right leg pain. Pt states the pain has been present for 3 days.

## 2018-05-09 ENCOUNTER — Encounter: Admission: EM | Disposition: E | Payer: Self-pay | Source: Home / Self Care | Attending: Family Medicine

## 2018-05-09 LAB — GLUCOSE, BODY FLUID OTHER: GLUCOSE, BODY FLUID OTHER: 5 mg/dL

## 2018-05-09 LAB — PROTEIN, BODY FLUID (OTHER): Total Protein, Body Fluid Other: 5.3 g/dL

## 2018-05-09 SURGERY — ARTHROSCOPY, KNEE
Anesthesia: Choice | Laterality: Right

## 2018-05-09 SURGICAL SUPPLY — 41 items
ADAPTER IRRIG TUBE 2 SPIKE SOL (ADAPTER) ×6 IMPLANT
BLADE SURG SZ11 CARB STEEL (BLADE) ×3 IMPLANT
BNDG ESMARK 6X12 TAN STRL LF (GAUZE/BANDAGES/DRESSINGS) IMPLANT
BRUSH SCRUB EZ  4% CHG (MISCELLANEOUS) ×2
BRUSH SCRUB EZ 4% CHG (MISCELLANEOUS) ×1 IMPLANT
BUR RADIUS 3.5 (BURR) ×3 IMPLANT
BUR RADIUS 4.0X18.5 (BURR) ×3 IMPLANT
CHLORAPREP W/TINT 26ML (MISCELLANEOUS) ×3 IMPLANT
CLOSURE WOUND 1/2 X4 (GAUZE/BANDAGES/DRESSINGS) ×1
COOLER POLAR GLACIER W/PUMP (MISCELLANEOUS) ×3 IMPLANT
CUFF TOURN 24 STER (MISCELLANEOUS) IMPLANT
CUFF TOURN 30 STER DUAL PORT (MISCELLANEOUS) IMPLANT
DRAPE IMP U-DRAPE 54X76 (DRAPES) ×3 IMPLANT
GAUZE SPONGE 4X4 12PLY STRL (GAUZE/BANDAGES/DRESSINGS) ×3 IMPLANT
GLOVE BIOGEL PI IND STRL 8 (GLOVE) ×1 IMPLANT
GLOVE BIOGEL PI INDICATOR 8 (GLOVE) ×2
GLOVE SURG SYN 7.5  E (GLOVE) ×2
GLOVE SURG SYN 7.5 E (GLOVE) ×1 IMPLANT
GOWN STRL REUS W/ TWL LRG LVL3 (GOWN DISPOSABLE) ×1 IMPLANT
GOWN STRL REUS W/TWL LRG LVL3 (GOWN DISPOSABLE) ×2
GOWN STRL REUS W/TWL LRG LVL4 (GOWN DISPOSABLE) ×3 IMPLANT
IV LACTATED RINGER IRRG 3000ML (IV SOLUTION) ×8
IV LR IRRIG 3000ML ARTHROMATIC (IV SOLUTION) ×4 IMPLANT
KIT TURNOVER KIT A (KITS) ×3 IMPLANT
MANIFOLD NEPTUNE II (INSTRUMENTS) ×3 IMPLANT
MAT ABSORB  FLUID 56X50 GRAY (MISCELLANEOUS) ×2
MAT ABSORB FLUID 56X50 GRAY (MISCELLANEOUS) ×1 IMPLANT
NEEDLE HYPO 22GX1.5 SAFETY (NEEDLE) ×3 IMPLANT
PACK ARTHROSCOPY KNEE (MISCELLANEOUS) ×3 IMPLANT
PAD ABD DERMACEA PRESS 5X9 (GAUZE/BANDAGES/DRESSINGS) ×6 IMPLANT
PAD WRAPON POLAR KNEE (MISCELLANEOUS) ×1 IMPLANT
SET TUBE SUCT SHAVER OUTFL 24K (TUBING) ×3 IMPLANT
SET TUBE TIP INTRA-ARTICULAR (MISCELLANEOUS) ×3 IMPLANT
STRIP CLOSURE SKIN 1/2X4 (GAUZE/BANDAGES/DRESSINGS) ×2 IMPLANT
SUT ETHILON 4-0 (SUTURE) ×2
SUT ETHILON 4-0 FS2 18XMFL BLK (SUTURE) ×1
SUTURE ETHLN 4-0 FS2 18XMF BLK (SUTURE) ×1 IMPLANT
TUBING ARTHRO INFLOW-ONLY STRL (TUBING) ×3 IMPLANT
WAND HAND CNTRL MULTIVAC 50 (MISCELLANEOUS) IMPLANT
WAND HAND CNTRL MULTIVAC 90 (MISCELLANEOUS) IMPLANT
WRAPON POLAR PAD KNEE (MISCELLANEOUS) ×3

## 2018-05-12 ENCOUNTER — Telehealth: Payer: Self-pay

## 2018-05-12 LAB — BODY FLUID CULTURE

## 2018-05-12 NOTE — Telephone Encounter (Signed)
Informed [employer] that death certificate needs to go to hospitalist and not Pulm[facility]. They will send some-one to pick-up.

## 2018-05-12 NOTE — Telephone Encounter (Signed)
Recieved Death Certificate from [employer]__________ Delivered/Placed _______nurse box_____

## 2018-05-13 LAB — CULTURE, BLOOD (ROUTINE X 2)
CULTURE: NO GROWTH
Special Requests: ADEQUATE

## 2018-05-17 NOTE — Progress Notes (Signed)
RRT page received for the patient. Upon arrival, the care team was assessing the patient's status. Chaplain entered room and offered silent prayers while awaiting an update. Her vitals continued to diminish. Patient was listed as DNR and she was presumed to have died. Nursing staff awaited doctor's arrival to call time of death. Upon hearing this news, the chaplain offered prayer for the patient, her family and friends, and the medical staff who had cared for her [facility] at [facility].    05/08/2018 0100  Clinical Encounter Type  Visited With Patient not available  Visit Type Code

## 2018-05-17 NOTE — Progress Notes (Signed)
   05-10-2018 0400  Clinical Encounter Type  Visited With Health care provider  Visit Type Follow-up;Death  Referral From Nurse   Chaplain received a call from the patient's nurse that the patient's family had arrived in the wake of her death and needed a chaplain. Chaplain made the nurse aware that a call was received a few moments ago regarding another patient's death and that she would try her best to be with Helen Moore's family as soon as possible. Due to the previous call, the chaplain was unable to get to this grieving family before their need to depart. Chaplain talked with the patient's nurse, thanked her for her care of the patient and family, and made her aware that she prayed for the patient and her grieving family-whom she had not yet met-before leaving the RR call. The patient's nurse expressed gratitude and understanding.

## 2018-05-17 NOTE — Progress Notes (Addendum)
At around 0043, pt called and asked to be propped up on the bed. This writer came in the pt's room in less than a minute and noted that pt was barely responsive and gasping for breath. Sternal rub X2 done with minimal to no response. Vitals taken, BP=68/38. Rapid Response Team called and came around 0045, pt hooked on the monitor, oral suction rendered. Pt showed asystole on the monitor. Nursing Supervisor Judeth CornfieldStephanie at bedside, Dr. [name] notified, came and pronounced pt's death at 0115. Nursing Supervisor Judeth CornfieldStephanie to contact family/significant other.

## 2018-05-17 NOTE — Progress Notes (Signed)
Spoke with Bradly Bienenstockanielle Monroe from [facility] to set up services. Reference number [id number].

## 2018-05-17 NOTE — Death Summary Note (Signed)
[facility] - [place] at [facility]    Death Note - please see Last Note for all details.  PATIENT NAME: Helen CraneGertrude Kelch    MR#:  [id number]  DATE OF BIRTH:  25-Dec-1934  DATE OF ADMISSION:  04/27/2018  PRIMARY CARE PHYSICIAN: Rayetta HumphreyGeorge, Sionne A, MD   ADMISSION DIAGNOSIS:  Pyogenic arthritis of right knee joint, due to unspecified organism (HCC) [M00.9]  HISTORY OF PRESENT ILLNESS ON ADMISSION (as per Dr Katheren ShamsSalary):  Helen CraneGertrude Moore  is a 83 y.o. female with a known history per below per below, status post right knee replacement over 14 years ago presents with acute right knee swelling with associated pain, unable to walk/stand now, fevers at home, in the emergency room patient was found to have septic right knee, knee was aspirated by ED attending-frank pus was removed, hospitalist asked to admit, patient evaluated emergency room, no apparent distress, resting company in bed, patient now be admitted for acute right septic knee/prostatic joint infection.  HOSPITAL COURSE:  Patient was admitted to the floor with IV antibiotics.  Her BMP resulted with hyperkalemia at 6.7, though EKG was reassuring without any t wave peaking.  She was given IV calcium, insulin with D50 and lasix.  Patient was resting tonight when she called to the nursing station for assistance in sitting her bed up.  Nursing entered and the patient became unresponsive and then agonal.  Rapid response was called and her blood pressure was found to be low.  IV fluid bolus started and she was placed on cardiac monitor.  She became bradycardic and then quickly went into asystole.  She was DNR so no code procedures were performed. Suspect fulminant acute coronary event in setting of septic knee infection with prior known CAD.    Pronounced dead by [name], [name] on 07-21-2018 at 1:15 AM after 1 full minute of auscultation revealed absent heart and lung sounds, absent corneal and pupillary reflexes, and  absent response to painful stimuli.                  Cause of death: Acute coronary event, septic knee infection   [name] 07-21-2018, 1:18 AM  [facility] - [place] [facility]    OFFICE [phone]  Total clinical and documentation time for today: <30 minutes  Note:  This document was prepared using Dragon voice recognition software and may include unintentional dictation errors.

## 2018-05-17 DEATH — deceased

## 2019-09-23 IMAGING — MR MR SHOULDER*L* W/O CM
5 series · 40 of 40 positions shown · non-contrast
Comparison: None.

CLINICAL DATA: Multiple falls.  Left shoulder pain.

EXAM:
MRI OF THE LEFT SHOULDER WITHOUT CONTRAST
TECHNIQUE: Multiplanar, multisequence MR imaging of the shoulder was performed.
No intravenous contrast was administered.

[Series 2: T2 fat-sat · axial · 4.0mm · 0.47mm/px · z∈[-23,+82]mm · 8 of 25 slices shown (1 of 3)]
[im 1/25]
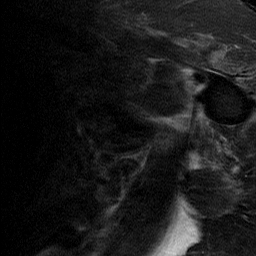
[im 4/25]
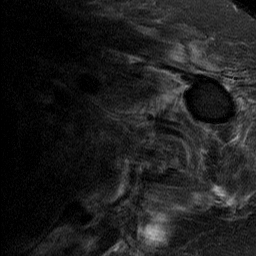
[im 7/25]
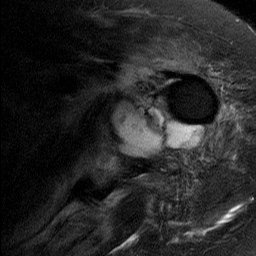
[im 11/25]
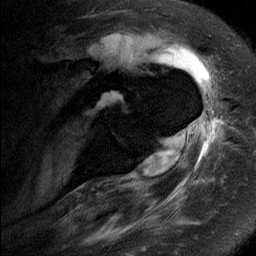
[im 14/25]
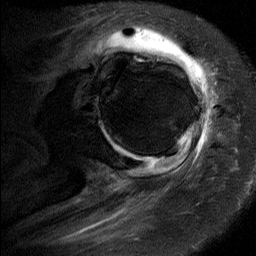
[im 18/25]
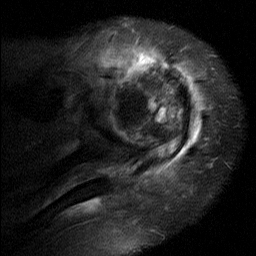
[im 21/25]
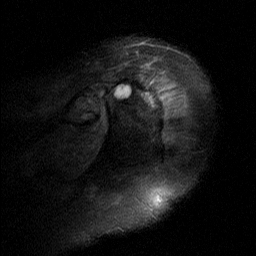
[im 25/25]
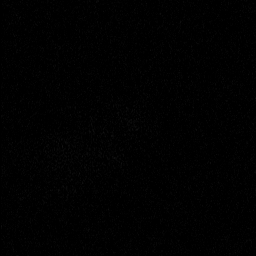

[Series 3: T2 fat-sat · oblique · 4.0mm · 0.62mm/px · 8 of 23 slices shown (2 of 3)]
[im 1/23]
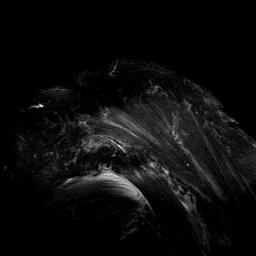
[im 4/23]
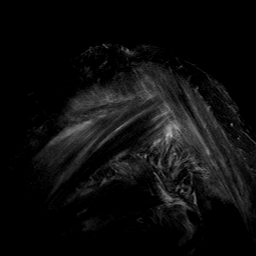
[im 7/23]
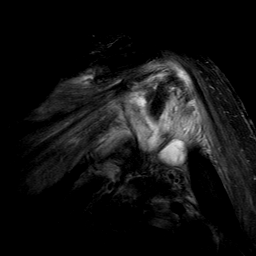
[im 10/23]
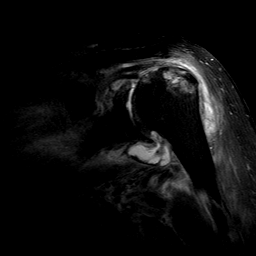
[im 13/23]
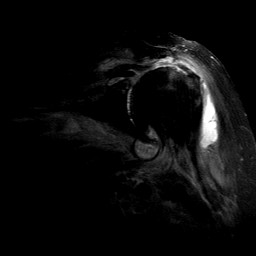
[im 16/23]
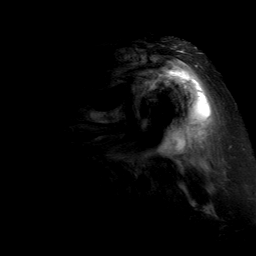
[im 19/23]
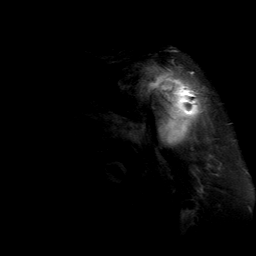
[im 23/23]
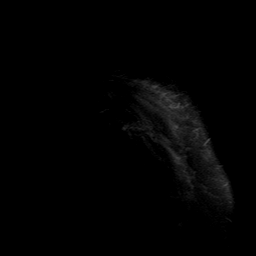

[Series 4: PD · oblique · 4.0mm · 0.62mm/px · 8 of 23 slices shown]
[im 1/23]
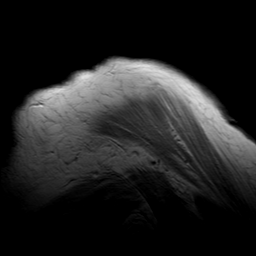
[im 4/23]
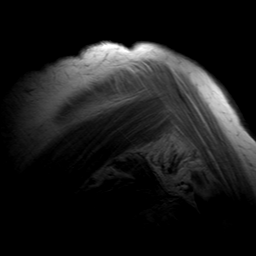
[im 7/23]
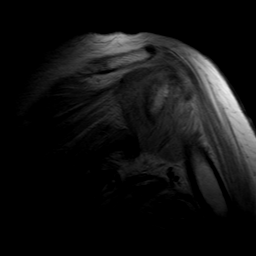
[im 10/23]
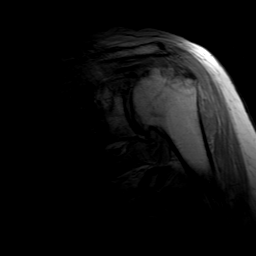
[im 13/23]
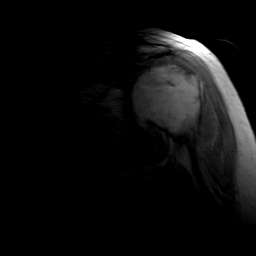
[im 16/23]
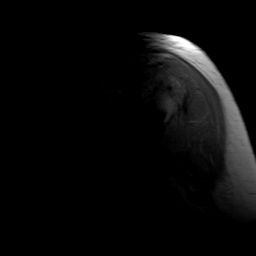
[im 19/23]
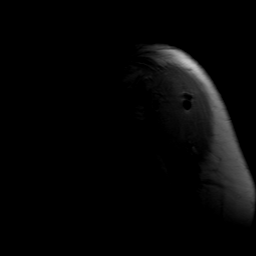
[im 23/23]
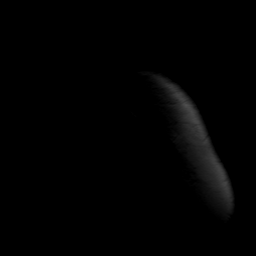

[Series 5: T1 · oblique · 4.0mm · 0.62mm/px · 8 of 23 slices shown]
[im 1/23]
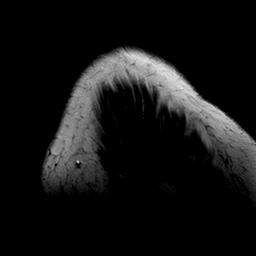
[im 4/23]
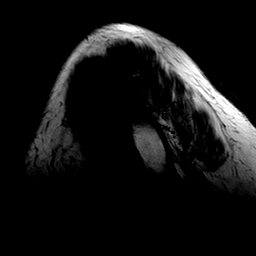
[im 7/23]
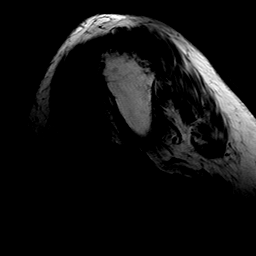
[im 10/23]
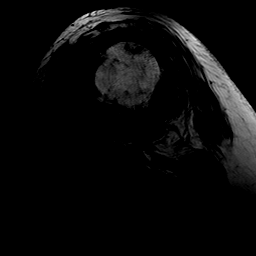
[im 13/23]
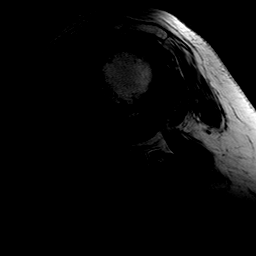
[im 16/23]
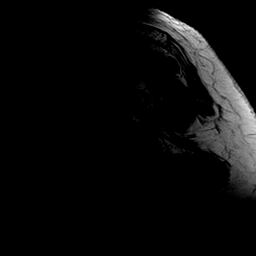
[im 19/23]
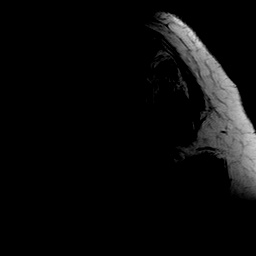
[im 23/23]
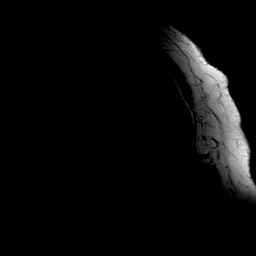

[Series 6: T2 fat-sat · oblique · 4.0mm · 0.62mm/px · 8 of 23 slices shown (3 of 3)]
[im 1/23]
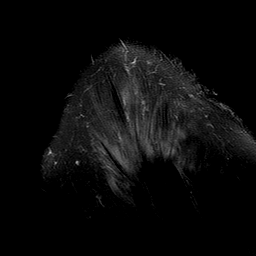
[im 4/23]
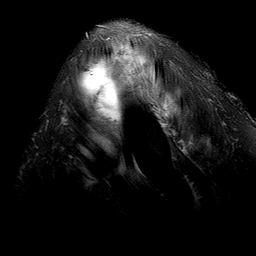
[im 7/23]
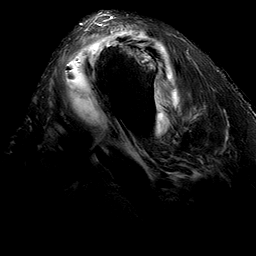
[im 10/23]
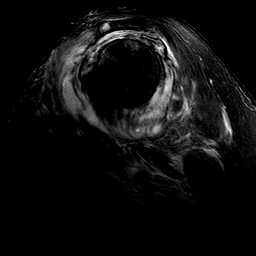
[im 13/23]
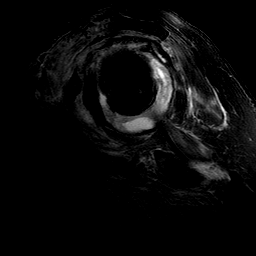
[im 16/23]
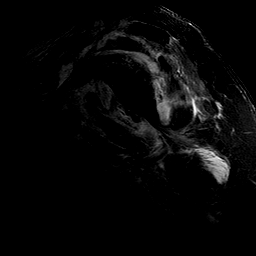
[im 19/23]
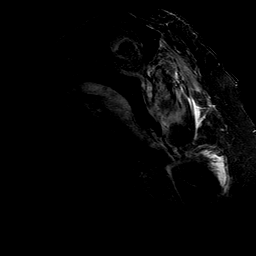
[im 23/23]
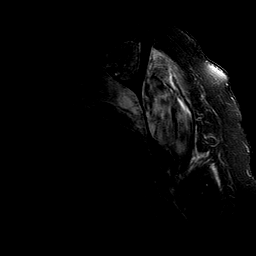

[40 of 40 positions shown; findings below may reference images not displayed]

FINDINGS: Rotator cuff: Complete tear of the supraspinatus tendon with 14 mm
of retraction. Moderate tendinosis of the infraspinatus tendon.
Teres minor tendon is intact. Mild tendinosis of the subscapularis
tendon.

Muscles: Mild fatty atrophy of the supraspinatus muscle. Muscle
edema in the infraspinatus and subscapularis muscle.

Biceps long head: Intraarticular portion of the long head of the
biceps tendon is not visualized concerning for a complete tear.

Acromioclavicular Joint: Moderate arthropathy of the
acromioclavicular joint. Type II acromion. Large amount of
subacromial/subdeltoid bursal fluid with multiple synechiae. Small
foci of low signal along the non deep tendon aspect of the bursa
anteriorly likely reflecting small locules of air.

Glenohumeral Joint: Moderate joint effusion with synovitis.
Extensive full-thickness cartilage loss of the glenohumeral joint.

Labrum:  Diffuse labral degeneration.

Bones: Subcortical reactive marrow changes in the greater
tuberosity. No other marrow signal abnormality. No fracture or
dislocation.

Other: No fluid collection or hematoma.
IMPRESSION: 1. Complete tear of the supraspinatus tendon with 14 mm of
retraction.
2. Moderate tendinosis of the infraspinatus tendon.
3. Severe advanced osteoarthritis of the left glenohumeral joint.
4. Moderate glenohumeral joint effusion with synovitis.
5. Severe subacromial/subdeltoid bursitis.
6. Severe muscle edema in the infraspinatus and subscapularis
muscles which may be reactive versus secondary to muscle strain.
7. If there is clinical concern regarding septic arthritis,
recommend arthrocentesis.

## 2019-09-25 IMAGING — DX DG CHEST 1V PORT
1 series · 1 of 1 positions shown · non-contrast
Comparison: 07/24/2017 chest radiograph

CLINICAL DATA: 82 y/o  F; new right-sided PICC line.

EXAM:
PORTABLE CHEST 1 VIEW

[chest ap]
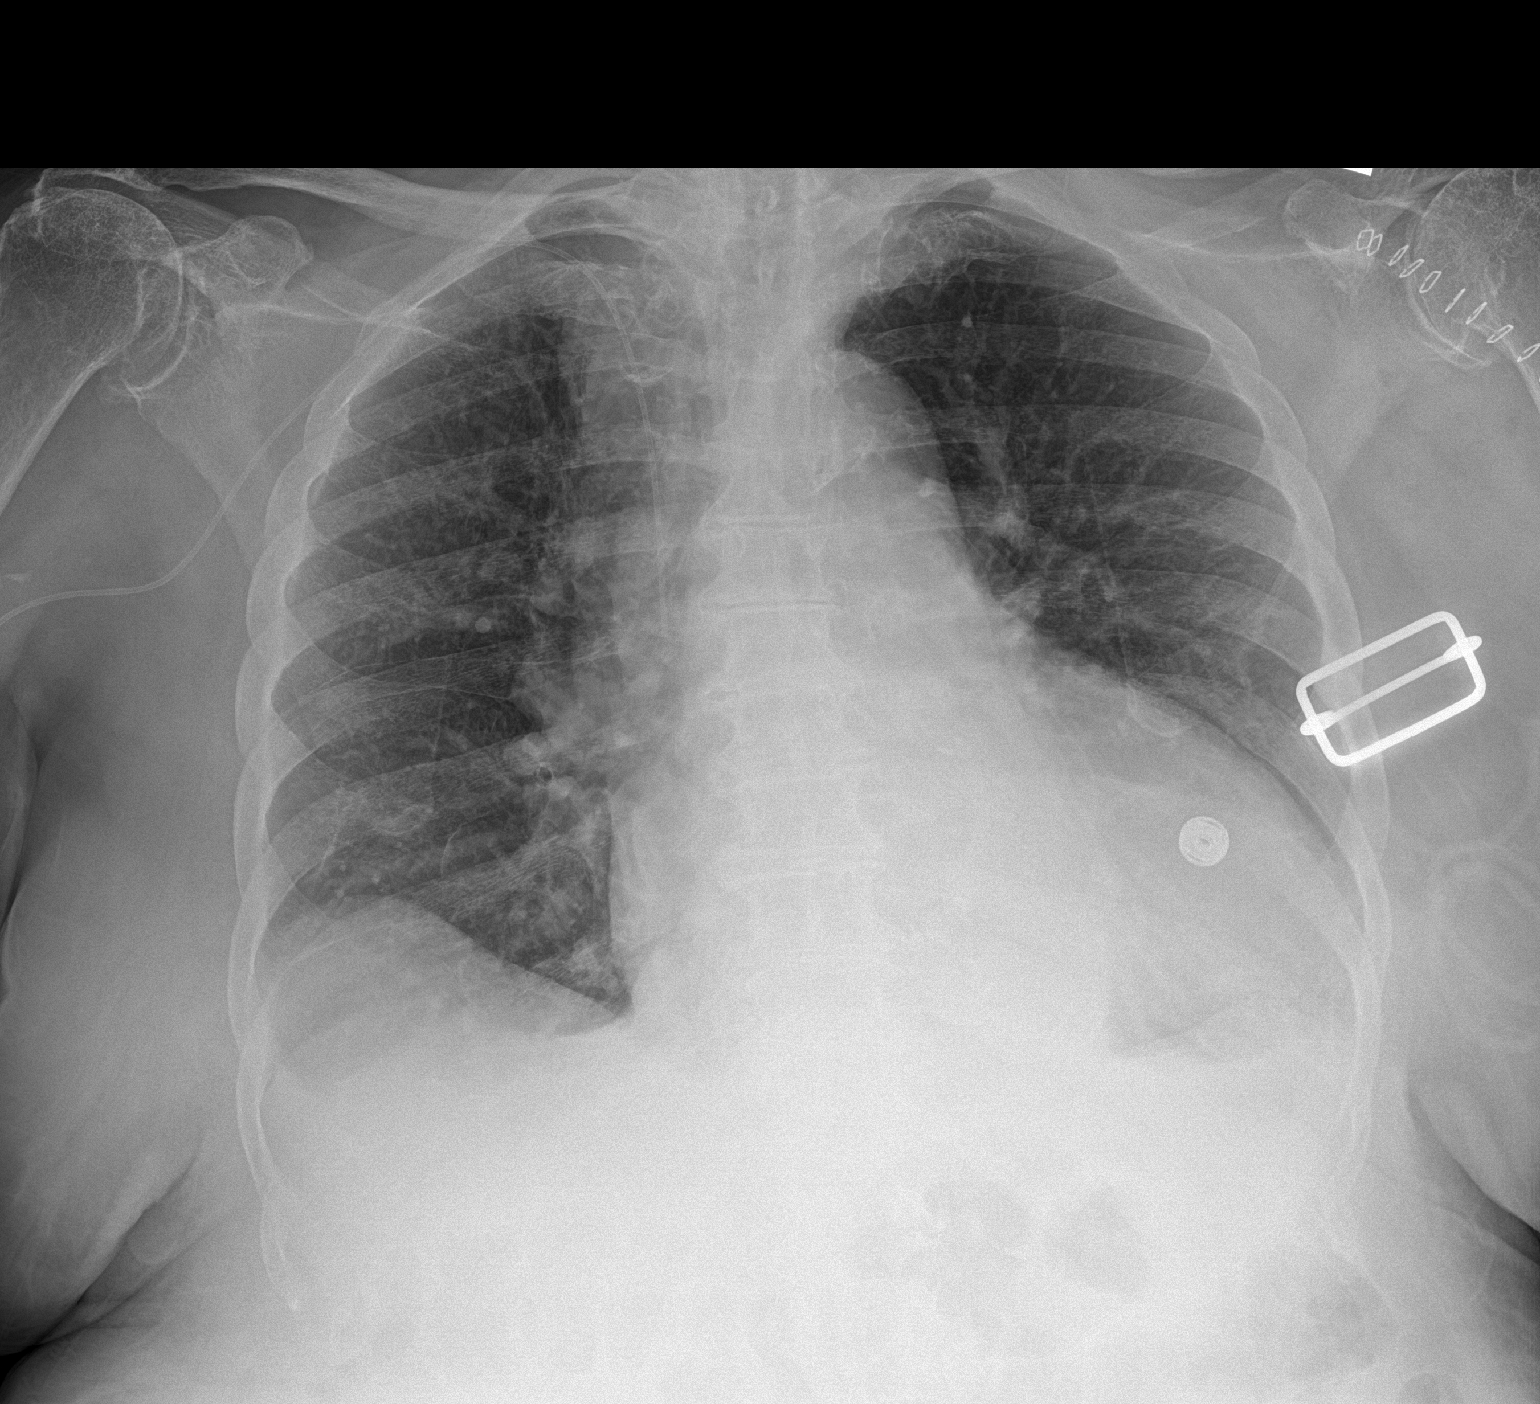

[1 of 1 positions shown; findings below may reference images not displayed]

FINDINGS: Stable cardiomegaly given projection and technique. Right PICC line
tip projects over mid SVC. Stable blunted costal diaphragmatic
angles, possible trace effusions. Clear lungs. Multilevel
degenerative changes of the spine.
IMPRESSION: Right PICC line tip projects over mid SVC. Stable cardiomegaly.
Possible trace pleural effusions. Clear lungs.

By: Lamberto Hervey M.D.
